# Patient Record
Sex: Male | Born: 1935 | Race: White | Hispanic: No | Marital: Married | Smoking: Current every day smoker
Health system: Southern US, Community
[De-identification: ages and names within clinical notes are randomized; demographics above are authoritative.]

## PROBLEM LIST (undated history)

## (undated) DIAGNOSIS — R972 Elevated prostate specific antigen [PSA]: Secondary | ICD-10-CM

## (undated) DIAGNOSIS — N472 Paraphimosis: Secondary | ICD-10-CM

## (undated) DIAGNOSIS — R011 Cardiac murmur, unspecified: Secondary | ICD-10-CM

## (undated) DIAGNOSIS — N400 Enlarged prostate without lower urinary tract symptoms: Secondary | ICD-10-CM

## (undated) DIAGNOSIS — R339 Retention of urine, unspecified: Secondary | ICD-10-CM

## (undated) DIAGNOSIS — E785 Hyperlipidemia, unspecified: Secondary | ICD-10-CM

## (undated) DIAGNOSIS — R310 Gross hematuria: Secondary | ICD-10-CM

## (undated) DIAGNOSIS — I639 Cerebral infarction, unspecified: Secondary | ICD-10-CM

## (undated) DIAGNOSIS — J449 Chronic obstructive pulmonary disease, unspecified: Secondary | ICD-10-CM

## (undated) DIAGNOSIS — R93429 Abnormal radiologic findings on diagnostic imaging of unspecified kidney: Secondary | ICD-10-CM

## (undated) DIAGNOSIS — I10 Essential (primary) hypertension: Secondary | ICD-10-CM

## (undated) DIAGNOSIS — Z466 Encounter for fitting and adjustment of urinary device: Secondary | ICD-10-CM

## (undated) DIAGNOSIS — N390 Urinary tract infection, site not specified: Secondary | ICD-10-CM

## (undated) DIAGNOSIS — C652 Malignant neoplasm of left renal pelvis: Secondary | ICD-10-CM

## (undated) DIAGNOSIS — K219 Gastro-esophageal reflux disease without esophagitis: Secondary | ICD-10-CM

## (undated) HISTORY — DX: Urinary tract infection, site not specified: N39.0

## (undated) HISTORY — DX: Abnormal radiologic findings on diagnostic imaging of unspecified kidney: R93.429

## (undated) HISTORY — DX: Hyperlipidemia, unspecified: E78.5

## (undated) HISTORY — DX: Gastro-esophageal reflux disease without esophagitis: K21.9

## (undated) HISTORY — PX: BACK SURGERY: SHX140

## (undated) HISTORY — PX: EAR MASTOIDECTOMY W/ COCHLEAR IMPLANT W/ LANDMARK: SHX1483

## (undated) HISTORY — PX: APPENDECTOMY: SHX54

## (undated) HISTORY — DX: Malignant neoplasm of left renal pelvis: C65.2

## (undated) HISTORY — DX: Elevated prostate specific antigen (PSA): R97.20

## (undated) HISTORY — PX: TONSILLECTOMY: SUR1361

## (undated) HISTORY — PX: CATARACT EXTRACTION W/ INTRAOCULAR LENS  IMPLANT, BILATERAL: SHX1307

## (undated) HISTORY — DX: Gross hematuria: R31.0

## (undated) HISTORY — DX: Chronic obstructive pulmonary disease, unspecified: J44.9

## (undated) HISTORY — DX: Paraphimosis: N47.2

## (undated) HISTORY — PX: COCHLEAR IMPLANT: SUR684

## (undated) HISTORY — DX: Essential (primary) hypertension: I10

## (undated) HISTORY — DX: Benign prostatic hyperplasia without lower urinary tract symptoms: N40.0

## (undated) HISTORY — PX: ELBOW SURGERY: SHX618

## (undated) HISTORY — DX: Encounter for fitting and adjustment of urinary device: Z46.6

## (undated) HISTORY — PX: TOTAL KNEE ARTHROPLASTY: SHX125

## (undated) HISTORY — DX: Retention of urine, unspecified: R33.9

---

## 1978-12-08 HISTORY — PX: FOOT SURGERY: SHX648

## 2007-04-03 ENCOUNTER — Other Ambulatory Visit: Payer: Self-pay

## 2007-04-04 ENCOUNTER — Inpatient Hospital Stay: Payer: Self-pay | Admitting: *Deleted

## 2008-12-08 DIAGNOSIS — I639 Cerebral infarction, unspecified: Secondary | ICD-10-CM

## 2008-12-08 HISTORY — DX: Cerebral infarction, unspecified: I63.9

## 2009-01-15 ENCOUNTER — Ambulatory Visit: Payer: Self-pay | Admitting: Internal Medicine

## 2009-07-25 ENCOUNTER — Emergency Department: Payer: Self-pay | Admitting: Emergency Medicine

## 2011-06-20 DIAGNOSIS — I351 Nonrheumatic aortic (valve) insufficiency: Secondary | ICD-10-CM | POA: Insufficient documentation

## 2011-08-25 DIAGNOSIS — N138 Other obstructive and reflux uropathy: Secondary | ICD-10-CM | POA: Insufficient documentation

## 2011-08-25 DIAGNOSIS — G2581 Restless legs syndrome: Secondary | ICD-10-CM | POA: Insufficient documentation

## 2011-08-27 DIAGNOSIS — L309 Dermatitis, unspecified: Secondary | ICD-10-CM | POA: Insufficient documentation

## 2011-12-24 DIAGNOSIS — M5412 Radiculopathy, cervical region: Secondary | ICD-10-CM | POA: Insufficient documentation

## 2011-12-24 DIAGNOSIS — M545 Low back pain, unspecified: Secondary | ICD-10-CM | POA: Insufficient documentation

## 2011-12-24 DIAGNOSIS — J449 Chronic obstructive pulmonary disease, unspecified: Secondary | ICD-10-CM | POA: Insufficient documentation

## 2012-05-22 ENCOUNTER — Inpatient Hospital Stay: Payer: Self-pay | Admitting: Internal Medicine

## 2012-05-22 LAB — CBC
HCT: 20.6 % — ABNORMAL LOW
HGB: 5.6 g/dL — ABNORMAL LOW
MCH: 16.4 pg — ABNORMAL LOW
MCHC: 27.4 g/dL — ABNORMAL LOW
MCV: 60 fL — ABNORMAL LOW
Platelet: 163 x10 3/mm 3
RBC: 3.45 x10 6/mm 3 — ABNORMAL LOW
RDW: 19 % — ABNORMAL HIGH
WBC: 5.6 x10 3/mm 3

## 2012-05-22 LAB — CK TOTAL AND CKMB (NOT AT ARMC)
CK, Total: 52 U/L
CK-MB: 1.8 ng/mL

## 2012-05-22 LAB — BASIC METABOLIC PANEL
BUN: 9 mg/dL (ref 7–18)
Calcium, Total: 8.8 mg/dL (ref 8.5–10.1)
Chloride: 107 mmol/L (ref 98–107)
EGFR (African American): >60
EGFR (Non-African Amer.): >60
Glucose: 109 mg/dL — ABNORMAL HIGH (ref 65–99)
Osmolality: 282 (ref 275–301)
Potassium: 3.8 mmol/L (ref 3.5–5.1)

## 2012-05-22 LAB — IRON AND TIBC
Iron Bind.Cap.(Total): 422 ug/dL
Iron Saturation: 3 %
Iron: 12 ug/dL — ABNORMAL LOW
Unbound Iron-Bind.Cap.: 410 ug/dL

## 2012-05-22 LAB — FOLATE: Folic Acid: 17.3 ng/mL (ref 3.1–100.0)

## 2012-05-22 LAB — FERRITIN: Ferritin (ARMC): 9 ng/mL (ref 8–388)

## 2012-05-22 LAB — LACTATE DEHYDROGENASE: LDH: 148 U/L (ref 87–241)

## 2012-05-22 LAB — PRO B NATRIURETIC PEPTIDE: B-Type Natriuretic Peptide: 351 pg/mL — ABNORMAL HIGH

## 2012-05-22 LAB — TROPONIN I: Troponin-I: <0.02 ng/mL

## 2012-05-23 LAB — CBC WITH DIFFERENTIAL/PLATELET
Basophil #: 0 10*3/uL (ref 0.0–0.1)
Basophil %: 0.8 %
Eosinophil %: 0.3 %
HCT: 29.5 % — ABNORMAL LOW (ref 40.0–52.0)
HGB: 8.7 g/dL — ABNORMAL LOW (ref 13.0–18.0)
Lymphocyte #: 0.4 10*3/uL — ABNORMAL LOW (ref 1.0–3.6)
Lymphocyte %: 13.8 %
MCH: 19.7 pg — ABNORMAL LOW (ref 26.0–34.0)
MCHC: 29.5 g/dL — ABNORMAL LOW (ref 32.0–36.0)
MCV: 67 fL — ABNORMAL LOW (ref 80–100)
Monocyte #: 0.1 x10 3/mm — ABNORMAL LOW (ref 0.2–1.0)
Monocyte %: 4.6 %
Neutrophil #: 2.3 10*3/uL (ref 1.4–6.5)
Neutrophil %: 80.5 %
RBC: 4.42 10*6/uL (ref 4.40–5.90)
RDW: 27.9 % — ABNORMAL HIGH (ref 11.5–14.5)
WBC: 2.8 10*3/uL — ABNORMAL LOW (ref 3.8–10.6)

## 2012-05-23 LAB — TROPONIN I
Troponin-I: 0.03 ng/mL
Troponin-I: 0.05 ng/mL

## 2012-05-23 LAB — BASIC METABOLIC PANEL
Calcium, Total: 9 mg/dL (ref 8.5–10.1)
Chloride: 103 mmol/L (ref 98–107)
EGFR (Non-African Amer.): >60
Glucose: 123 mg/dL — ABNORMAL HIGH (ref 65–99)
Osmolality: 275 (ref 275–301)
Sodium: 137 mmol/L (ref 136–145)

## 2012-05-23 LAB — CK-MB: CK-MB: 3.4 ng/mL (ref 0.5–3.6)

## 2012-05-23 LAB — CK TOTAL AND CKMB (NOT AT ARMC): CK, Total: 84 U/L (ref 35–232)

## 2012-05-24 LAB — CBC WITH DIFFERENTIAL/PLATELET
Basophil %: 0.5 %
Eosinophil %: 0.8 %
HCT: 27.8 % — ABNORMAL LOW (ref 40.0–52.0)
Lymphocyte #: 1.6 10*3/uL (ref 1.0–3.6)
Lymphocyte %: 20.9 %
MCHC: 30.2 g/dL — ABNORMAL LOW (ref 32.0–36.0)
MCV: 66 fL — ABNORMAL LOW (ref 80–100)
Monocyte #: 0.8 x10 3/mm (ref 0.2–1.0)
Monocyte %: 10.6 %
Neutrophil #: 5.2 10*3/uL (ref 1.4–6.5)
Neutrophil %: 67.2 %
RDW: 27.7 % — ABNORMAL HIGH (ref 11.5–14.5)

## 2012-05-26 LAB — EXPECTORATED SPUTUM ASSESSMENT W GRAM STAIN, RFLX TO RESP C

## 2012-05-27 ENCOUNTER — Ambulatory Visit: Payer: Self-pay | Admitting: Internal Medicine

## 2012-05-28 LAB — CULTURE, BLOOD (SINGLE)

## 2012-06-09 ENCOUNTER — Ambulatory Visit: Payer: Self-pay | Admitting: Internal Medicine

## 2012-06-09 LAB — CBC CANCER CENTER
Basophil #: 0.1 x10 3/mm (ref 0.0–0.1)
Basophil %: 1.7 %
Eosinophil #: 0.6 x10 3/mm (ref 0.0–0.7)
Lymphocyte %: 20.6 %
Neutrophil %: 58.8 %
Platelet: 235 x10 3/mm (ref 150–440)
RBC: 4.79 10*6/uL (ref 4.40–5.90)
WBC: 6.2 x10 3/mm (ref 3.8–10.6)

## 2012-06-09 LAB — RETICULOCYTES
Absolute Retic Count: 0.0911 10*6/uL — ABNORMAL HIGH (ref 0.024–0.084)
Reticulocyte: 1.9 % — ABNORMAL HIGH (ref 0.5–1.5)

## 2012-06-16 DIAGNOSIS — M5412 Radiculopathy, cervical region: Secondary | ICD-10-CM | POA: Insufficient documentation

## 2012-06-16 DIAGNOSIS — M5414 Radiculopathy, thoracic region: Secondary | ICD-10-CM | POA: Insufficient documentation

## 2012-07-08 ENCOUNTER — Ambulatory Visit: Payer: Self-pay | Admitting: Internal Medicine

## 2013-11-07 HISTORY — PX: TOTAL NEPHRECTOMY: SHX415

## 2014-07-02 ENCOUNTER — Emergency Department: Payer: Self-pay | Admitting: Emergency Medicine

## 2014-07-02 LAB — CBC
HCT: 46.9 % (ref 40.0–52.0)
HGB: 15.3 g/dL (ref 13.0–18.0)
MCH: 29.7 pg (ref 26.0–34.0)
MCHC: 32.6 g/dL (ref 32.0–36.0)
MCV: 91 fL (ref 80–100)
Platelet: 208 10*3/uL (ref 150–440)
RBC: 5.15 10*6/uL (ref 4.40–5.90)
RDW: 13.9 % (ref 11.5–14.5)
WBC: 9.2 10*3/uL (ref 3.8–10.6)

## 2014-07-02 LAB — URINALYSIS, COMPLETE
RBC,UR: 7740 /HPF (ref 0–5)
SPECIFIC GRAVITY: 1.008 (ref 1.003–1.030)
Squamous Epithelial: NONE SEEN

## 2014-07-02 LAB — BASIC METABOLIC PANEL
ANION GAP: 6 — AB (ref 7–16)
BUN: 9 mg/dL (ref 7–18)
CO2: 26 mmol/L (ref 21–32)
Calcium, Total: 9.6 mg/dL (ref 8.5–10.1)
Chloride: 105 mmol/L (ref 98–107)
Creatinine: 0.66 mg/dL (ref 0.60–1.30)
EGFR (African American): >60
EGFR (Non-African Amer.): >60
Glucose: 92 mg/dL (ref 65–99)
Osmolality: 272 (ref 275–301)
POTASSIUM: 4.1 mmol/L (ref 3.5–5.1)
SODIUM: 137 mmol/L (ref 136–145)

## 2014-07-04 ENCOUNTER — Ambulatory Visit: Payer: Self-pay | Admitting: Urology

## 2014-07-04 LAB — URINE CULTURE

## 2014-09-21 ENCOUNTER — Ambulatory Visit: Payer: Self-pay | Admitting: Urology

## 2014-09-21 LAB — URINALYSIS, COMPLETE
Bacteria: NONE SEEN
Bilirubin,UR: NEGATIVE
Blood: NEGATIVE
Glucose,UR: NEGATIVE mg/dL (ref 0–75)
Ketone: NEGATIVE
NITRITE: NEGATIVE
PROTEIN: NEGATIVE
Ph: 5 (ref 4.5–8.0)
RBC,UR: <1 /HPF (ref 0–5)
SQUAMOUS EPITHELIAL: NONE SEEN
Specific Gravity: 1.009 (ref 1.003–1.030)
WBC UR: 47 /HPF (ref 0–5)

## 2014-09-21 LAB — BASIC METABOLIC PANEL
ANION GAP: 7 (ref 7–16)
BUN: 14 mg/dL (ref 7–18)
CHLORIDE: 104 mmol/L (ref 98–107)
CO2: 28 mmol/L (ref 21–32)
Calcium, Total: 9.3 mg/dL (ref 8.5–10.1)
Creatinine: 0.73 mg/dL (ref 0.60–1.30)
EGFR (African American): >60
Glucose: 78 mg/dL (ref 65–99)
Osmolality: 277 (ref 275–301)
Potassium: 4.4 mmol/L (ref 3.5–5.1)
Sodium: 139 mmol/L (ref 136–145)

## 2014-09-21 LAB — CBC WITH DIFFERENTIAL/PLATELET
Basophil #: 0.1 10*3/uL (ref 0.0–0.1)
Basophil %: 1 %
EOS ABS: 0.5 10*3/uL (ref 0.0–0.7)
Eosinophil %: 5 %
HCT: 41 % (ref 40.0–52.0)
HGB: 13 g/dL (ref 13.0–18.0)
Lymphocyte #: 1.2 10*3/uL (ref 1.0–3.6)
Lymphocyte %: 13.7 %
MCH: 28.7 pg (ref 26.0–34.0)
MCHC: 31.7 g/dL — AB (ref 32.0–36.0)
MCV: 91 fL (ref 80–100)
MONO ABS: 0.8 x10 3/mm (ref 0.2–1.0)
Monocyte %: 8.6 %
Neutrophil #: 6.4 10*3/uL (ref 1.4–6.5)
Neutrophil %: 71.7 %
Platelet: 319 10*3/uL (ref 150–440)
RBC: 4.52 10*6/uL (ref 4.40–5.90)
RDW: 14.4 % (ref 11.5–14.5)
WBC: 8.9 10*3/uL (ref 3.8–10.6)

## 2014-09-21 LAB — PROTIME-INR
INR: 1
PROTHROMBIN TIME: 13 s (ref 11.5–14.7)

## 2014-09-21 LAB — APTT: Activated PTT: 40.6 secs — ABNORMAL HIGH (ref 23.6–35.9)

## 2014-09-23 LAB — URINE CULTURE

## 2014-10-10 ENCOUNTER — Ambulatory Visit: Payer: Self-pay | Admitting: Urology

## 2014-10-16 ENCOUNTER — Ambulatory Visit: Payer: Self-pay | Admitting: Urology

## 2014-10-16 LAB — URINALYSIS, COMPLETE
BLOOD: NEGATIVE
Bacteria: NONE SEEN
Bilirubin,UR: NEGATIVE
GLUCOSE, UR: NEGATIVE mg/dL (ref 0–75)
KETONE: NEGATIVE
Leukocyte Esterase: NEGATIVE
NITRITE: NEGATIVE
Ph: 5 (ref 4.5–8.0)
Protein: NEGATIVE
RBC,UR: <1 /HPF (ref 0–5)
SPECIFIC GRAVITY: 1.008 (ref 1.003–1.030)
Squamous Epithelial: <1
WBC UR: 2 /HPF (ref 0–5)

## 2014-10-18 LAB — URINE CULTURE

## 2014-10-23 ENCOUNTER — Ambulatory Visit: Payer: Self-pay | Admitting: Urology

## 2014-11-13 ENCOUNTER — Ambulatory Visit: Payer: Self-pay | Admitting: Urology

## 2014-11-14 ENCOUNTER — Ambulatory Visit: Payer: Self-pay | Admitting: Urology

## 2014-11-14 LAB — CBC
HCT: 31.8 % — AB (ref 40.0–52.0)
HGB: 10.2 g/dL — ABNORMAL LOW (ref 13.0–18.0)
MCH: 28.4 pg (ref 26.0–34.0)
MCHC: 32.1 g/dL (ref 32.0–36.0)
MCV: 89 fL (ref 80–100)
Platelet: 328 10*3/uL (ref 150–440)
RBC: 3.58 10*6/uL — ABNORMAL LOW (ref 4.40–5.90)
RDW: 14.8 % — ABNORMAL HIGH (ref 11.5–14.5)
WBC: 9.5 10*3/uL (ref 3.8–10.6)

## 2014-11-14 LAB — BASIC METABOLIC PANEL
Anion Gap: 6 — ABNORMAL LOW (ref 7–16)
BUN: 20 mg/dL — ABNORMAL HIGH (ref 7–18)
Calcium, Total: 9.4 mg/dL (ref 8.5–10.1)
Chloride: 106 mmol/L (ref 98–107)
Co2: 27 mmol/L (ref 21–32)
Creatinine: 0.83 mg/dL (ref 0.60–1.30)
EGFR (African American): >60
EGFR (Non-African Amer.): >60
Glucose: 93 mg/dL (ref 65–99)
OSMOLALITY: 280 (ref 275–301)
Potassium: 4.2 mmol/L (ref 3.5–5.1)
Sodium: 139 mmol/L (ref 136–145)

## 2014-11-14 LAB — URINALYSIS, COMPLETE
Bilirubin,UR: NEGATIVE
Glucose,UR: NEGATIVE mg/dL (ref 0–75)
Hyaline Cast: 2
Ketone: NEGATIVE
NITRITE: NEGATIVE
Ph: 6 (ref 4.5–8.0)
Protein: 30
SQUAMOUS EPITHELIAL: NONE SEEN
Specific Gravity: 1.012 (ref 1.003–1.030)

## 2014-11-14 LAB — APTT: ACTIVATED PTT: 41.1 s — AB (ref 23.6–35.9)

## 2014-11-14 LAB — PROTIME-INR
INR: 1.1
Prothrombin Time: 13.9 secs (ref 11.5–14.7)

## 2014-11-17 ENCOUNTER — Ambulatory Visit: Payer: Self-pay | Admitting: Internal Medicine

## 2014-11-17 LAB — URINE CULTURE

## 2014-11-20 ENCOUNTER — Inpatient Hospital Stay: Payer: Self-pay | Admitting: Urology

## 2014-11-21 LAB — CBC WITH DIFFERENTIAL/PLATELET
BASOS ABS: 0 10*3/uL (ref 0.0–0.1)
BASOS PCT: 0.3 %
EOS ABS: 0.2 10*3/uL (ref 0.0–0.7)
EOS PCT: 2.3 %
HCT: 29.4 % — ABNORMAL LOW (ref 40.0–52.0)
HGB: 9.4 g/dL — AB (ref 13.0–18.0)
LYMPHS ABS: 0.7 10*3/uL — AB (ref 1.0–3.6)
LYMPHS PCT: 8.8 %
MCH: 28.7 pg (ref 26.0–34.0)
MCHC: 31.9 g/dL — AB (ref 32.0–36.0)
MCV: 90 fL (ref 80–100)
Monocyte #: 0.5 x10 3/mm (ref 0.2–1.0)
Monocyte %: 5.8 %
NEUTROS ABS: 6.9 10*3/uL — AB (ref 1.4–6.5)
Neutrophil %: 82.8 %
Platelet: 210 10*3/uL (ref 150–440)
RBC: 3.27 10*6/uL — ABNORMAL LOW (ref 4.40–5.90)
RDW: 16.1 % — ABNORMAL HIGH (ref 11.5–14.5)
WBC: 8.3 10*3/uL (ref 3.8–10.6)

## 2014-11-21 LAB — BASIC METABOLIC PANEL
Anion Gap: 7 (ref 7–16)
BUN: 16 mg/dL (ref 7–18)
CALCIUM: 8.1 mg/dL — AB (ref 8.5–10.1)
CREATININE: 1.39 mg/dL — AB (ref 0.60–1.30)
Chloride: 107 mmol/L (ref 98–107)
Co2: 23 mmol/L (ref 21–32)
EGFR (African American): >60
EGFR (Non-African Amer.): 53 — ABNORMAL LOW
GLUCOSE: 103 mg/dL — AB (ref 65–99)
OSMOLALITY: 275 (ref 275–301)
Potassium: 4.3 mmol/L (ref 3.5–5.1)
Sodium: 137 mmol/L (ref 136–145)

## 2014-11-22 LAB — CBC WITH DIFFERENTIAL/PLATELET
BASOS ABS: 0 10*3/uL (ref 0.0–0.1)
Basophil %: 0.5 %
EOS PCT: 2.1 %
Eosinophil #: 0.2 10*3/uL (ref 0.0–0.7)
HCT: 27.3 % — ABNORMAL LOW (ref 40.0–52.0)
HGB: 8.6 g/dL — ABNORMAL LOW (ref 13.0–18.0)
LYMPHS PCT: 9.2 %
Lymphocyte #: 0.7 10*3/uL — ABNORMAL LOW (ref 1.0–3.6)
MCH: 28.2 pg (ref 26.0–34.0)
MCHC: 31.4 g/dL — ABNORMAL LOW (ref 32.0–36.0)
MCV: 90 fL (ref 80–100)
MONO ABS: 0.4 x10 3/mm (ref 0.2–1.0)
MONOS PCT: 5.6 %
Neutrophil #: 6.4 10*3/uL (ref 1.4–6.5)
Neutrophil %: 82.6 %
RBC: 3.04 10*6/uL — AB (ref 4.40–5.90)
RDW: 16.8 % — ABNORMAL HIGH (ref 11.5–14.5)
WBC: 7.7 10*3/uL (ref 3.8–10.6)

## 2014-11-22 LAB — BASIC METABOLIC PANEL
Anion Gap: 6 — ABNORMAL LOW (ref 7–16)
BUN: 16 mg/dL (ref 7–18)
CALCIUM: 7.9 mg/dL — AB (ref 8.5–10.1)
Chloride: 109 mmol/L — ABNORMAL HIGH (ref 98–107)
Co2: 24 mmol/L (ref 21–32)
Creatinine: 1.51 mg/dL — ABNORMAL HIGH (ref 0.60–1.30)
EGFR (African American): 58 — ABNORMAL LOW
EGFR (Non-African Amer.): 48 — ABNORMAL LOW
Glucose: 110 mg/dL — ABNORMAL HIGH (ref 65–99)
Osmolality: 279 (ref 275–301)
POTASSIUM: 4.4 mmol/L (ref 3.5–5.1)
Sodium: 139 mmol/L (ref 136–145)

## 2014-11-22 LAB — PLATELET COUNT: Platelet: 176 10*3/uL (ref 150–440)

## 2014-11-23 LAB — CREATININE, SERUM
CREATININE: 1.38 mg/dL — AB (ref 0.60–1.30)
EGFR (African American): >60
EGFR (Non-African Amer.): 53 — ABNORMAL LOW

## 2014-12-04 ENCOUNTER — Ambulatory Visit: Payer: Self-pay | Admitting: Urology

## 2014-12-07 ENCOUNTER — Ambulatory Visit: Payer: Self-pay | Admitting: Internal Medicine

## 2014-12-07 LAB — CBC CANCER CENTER
Basophil #: 0.1 x10 3/mm (ref 0.0–0.1)
Basophil %: 0.8 %
EOS PCT: 7.5 %
Eosinophil #: 0.7 x10 3/mm (ref 0.0–0.7)
HCT: 31.8 % — ABNORMAL LOW (ref 40.0–52.0)
HGB: 10.1 g/dL — AB (ref 13.0–18.0)
Lymphocyte #: 1.1 x10 3/mm (ref 1.0–3.6)
Lymphocyte %: 11.1 %
MCH: 27.5 pg (ref 26.0–34.0)
MCHC: 31.8 g/dL — AB (ref 32.0–36.0)
MCV: 86 fL (ref 80–100)
MONO ABS: 0.7 x10 3/mm (ref 0.2–1.0)
Monocyte %: 6.8 %
Neutrophil #: 7.3 x10 3/mm — ABNORMAL HIGH (ref 1.4–6.5)
Neutrophil %: 73.8 %
Platelet: 336 x10 3/mm (ref 150–440)
RBC: 3.68 10*6/uL — ABNORMAL LOW (ref 4.40–5.90)
RDW: 16.7 % — ABNORMAL HIGH (ref 11.5–14.5)
WBC: 9.9 x10 3/mm (ref 3.8–10.6)

## 2014-12-07 LAB — CREATININE, SERUM
CREATININE: 1.54 mg/dL — AB (ref 0.60–1.30)
EGFR (African American): 57 — ABNORMAL LOW
EGFR (Non-African Amer.): 47 — ABNORMAL LOW

## 2014-12-08 ENCOUNTER — Ambulatory Visit: Payer: Self-pay | Admitting: Internal Medicine

## 2015-03-31 NOTE — Discharge Summary (Signed)
PATIENT NAME:  Eric Mullins, Eric Mullins MR#:  [id number] DATE OF BIRTH:  1935-09-15  DATE OF ADMISSION:  10/23/2014 DATE OF DISCHARGE:  10/24/2014  DISCHARGE REASON:  Postoperative hematuria, hematuria secondary to renal cell collecting system tumor with instrumentation of same, and biopsy of same.   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   SURGEON: Rodgerick Gilliand D. Elnoria Howard DO and Gavin Pound MD.  COMPLICATIONS: Other than postoperative bleeding were none.   HOSPITAL COURSE:  The patient was admitted for control of postoperative bleeding, with 3-way 24 French Foley catheter.  Bleeding was well-controlled and by morning the urine catheter had clear drainage. The patient complained of some lower abdominal cramping and constipation and was given 2 ounces Milk of Magnesia and then began to have bowel movements.   He will be irrigated before discharge to make sure he is clear of clots. He will be seen in the morning for Foley catheter removal at the office if the urine remains clear through the night. The patient is urged to not smoke. He is discharged on his normal home meds. No antibiotics were given nor pain meds on discharge, as he merely has a Foley and he has pain meds at home to use. Follow-up in 24 to 48 hours for Foley catheter removal and a week for review of pathology report if possible, and then decision for further therapy.   CONDITION ON DISCHARGE:  Satisfactory.   PHYSICAL EXAMINATION: ABDOMEN: Soft. Bowel sounds present.  LUNGS: Expiratory wheezing which is chronic.  HEART: Sounds regular rate and rhythm. GENERAL:  Afebrile, alert and oriented x3. Eating and drinking copious amounts food.     ____________________________ Janice Coffin. Elnoria Howard, [place] rdh:kl D: 10/24/2014 13:34:32 ET T: 10/24/2014 15:14:53 ET JOB#: [id number]  cc: Janice Coffin. Elnoria Howard, DO,  <Dictator> Karley Pho D Tashae Inda DO ELECTRONICALLY SIGNED 11/22/2014 13:06

## 2015-03-31 NOTE — Op Note (Signed)
PATIENT NAME:  Eric Mullins, Eric Mullins MR#:  [id number] DATE OF BIRTH:  Oct 21, 1935  DATE OF PROCEDURE:  10/23/2014  PREOPERATIVE DIAGNOSES: Left upper tract filling defect, benign prostatic hyperplasia.  POSTOPERATIVE DIAGNOSES: Left upper tract filling defect, benign prostatic hyperplasia.  PROCEDURE PERFORMED: Left ureteroscopy, biopsy of left upper tract tumor, left ureteral stent placement.   ATTENDING SURGEON: Sherlynn Stalls, MD  ASSISTANT: Janice Coffin. Elnoria Howard, DO  SPECIMEN: Left collecting system biopsy.   DRAINS: A 6 x 26 French double-J ureteral stent on the left.   COMPLICATIONS: None.   INDICATION: This is a 80 year old male with a history of massive BPH with a prostatic volume of 150 mL, large  median lobe, as well as an elevated PSA to 17 status post negative prostate biopsy. As part of his hematuria workup, he underwent a CT urogram which did show left upper pole filling defects suspicious for upper tract TCC. Office cystoscopy revealed a large median lobe and the UO was unable to be identified. As such, he was counseled to undergo laser ablation of the median lobe of his prostate in order to access his ureteral orifice for the purpose of diagnostic ureteroscopy. Risks and benefits of the procedure were explained in detail. The patient agreed to proceed as planned.   PROCEDURE: The patient was correctly identified in the preoperative holding area and informed consent was obtained. He was brought to the operating suite and placed on the table in a supine position. At this time, universal timeout protocol was performed. All team members were identified. Venodyne boots were placed and he was administered IV Levaquin 500 mg in the perioperative period. He was then placed under general anesthesia, repositioned in the dorsal lithotomy position and prepped and draped in the standard surgical fashion. At this time, Dr. Elnoria Howard advanced a rigid cystoscope using a 65 French access sheath per urethra into  the bladder. Of note, his prostate was massively enlarged with a very elevated bladder neck. We were unable to identify the ureteral orifices at this time and therefore, the decision was made to go ahead and proceed with GreenLight laser ablation to create a trough and take down the bladder neck height in order to identify the ureteral orifice for this purpose. Please see Dr. Guinevere Ferrari dictation of his GreenLight laser ablation as he was primary surgeon for this portion of the procedure. Once this trough was created and the ureteral orifice on the left was able to be identified, it was cannulated using a Sensor wire up to the level of the kidney. A second wire was introduced using the dual-lumen access sheath. Of note, there was significant buckling and deviation of the bladder due to very large mass effect for the prostate making the procedure extremely difficult. Ultimately, a second wire was able to be advanced. We then attempted to place a Cook 12 [place] access sheath over the working wire snapping the safety wire in place, however, due to buckling we were unable to place this successfully. We tried the inner sheath of the cannula, which did pass through the ureteral orifice to the distal ureter, however, the outer sheath did not go easily. Due to the buckling, the decision was made to exchange one working wires for a Super Stiff wire, which was achieved using the same technique as previously described using the dual-lumen access sheath. We then attempted to place the access sheath over the stiffer wire, again we were unsuccessful at this point. Next, the rigid cystoscope was advanced per urethra into the  bladder over our Super Stiff wire in order to straighten out the wire and help reduce the amount of buckling in order to be able to more easily advance our instruments through the ureter. This was relatively successful and did assist in help straightening the wire reducing the amount of buckling. At this point, we  were able to advance a 7 [place] scope over the Super Stiff wire up to the level of the renal pelvis. Again, this was very technically difficult given the patient's prostate making maneuverability very difficult. Ultimately, we were able to access the upper pole where a large tumor filling the entire calyx was seen. It did have a somewhat low grade appearance but was relatively large in size. The Piranha biopsy forceps were then used to take a decent-sized hot biopsy, which was then backed down the ureter carefully with care to ensure that this piece would not become dislodged or lost. Ultimately, we did have a very small micro biopsy for analysis. At this point in time, the working wire was removed and the safety wire was backloaded over the rigid cystoscope. I then very carefully advanced a 6 x 26 French double-J ureteral stent over this wire up to the level of the renal pelvis. Again, this was quite difficult due to significant buckling in the bladder, but ultimately it was successful in creating a coil in the renal pelvis as well as a coil within the bladder when the wire was ultimately removed. The remainder of the procedure was turned over to Dr. Elnoria Howard who proceeded with additional hemostasis and catheter placement. There were no complications in this case.   ____________________________ Sherlynn Stalls, MD ajb:TT D: 10/23/2014 18:32:02 ET T: 10/23/2014 19:48:29 ET JOB#: [id number]  cc: Sherlynn Stalls, MD, <Dictator> Sherlynn Stalls MD ELECTRONICALLY SIGNED 11/22/2014 10:38

## 2015-03-31 NOTE — Op Note (Signed)
PATIENT NAME:  Eric Mullins, Eric Mullins MR#:  [id number] DATE OF BIRTH:  1935/07/10  DATE OF PROCEDURE:  10/23/2014  PREOPERATIVE DIAGNOSIS: Large middle lobe hypertrophy of the prostate and a left upper  pole intrarenal pelvic tumor.   POSTOPERATIVE DIAGNOSIS: Large middle lobe hypertrophy of the prostate and a left upper pole intrarenal pelvic tumor.   PROCEDURES: Cystoscopy, KPT laser of massive prostate, left retrograde ureteroscopy, left ureteral dilatation, renal pelvic tumor biopsy, stent placement and Foley catheter placement.  SURGEON: Rick Duff, Eric Mullins  DESCRIPTION OF PROCEDURE: The KPT laser procedure is as follows: With the patient sterilely prepped and draped, and in the supine lithotomy position i Eric Mullins the cystoscopy. I cannot identify the left ureter so I Eric Mullins a KPT laser on a massive middle lobe hypertrophy of the prostate.  I am able to make a large channel and break down the middle lobe hypertrophy somewhat and then I am able to see the left ureter. We instrument the ureter and the dictation on the ureteroscopy will be by Dr. Erlene Quan who did the ureteroscopy and renal pelvic tumor biopsy. At the end of the procedure, I put a Foley catheter in over a wire. This is a [employer] technical [employer]. I put it to full irrigation with constant bladder irrigation to clear. The patient will probably have to be admitted tonight. Most of the bleeding comes from the manipulation during the ureteroscopy. So at the end of the procedure, a B and O suppository is placed. He is sent to recovery in satisfactory condition with a fluoroscopically-guided stent in good position.  ____________________________ Eric Coffin. Elnoria Howard, Eric Mullins rdh:sb D: 10/23/2014 10:46:35 ET T: 10/23/2014 12:46:49 ET JOB#: [id number]  cc: Eric Coffin. Elnoria Howard, Eric Mullins, <Dictator> Neamiah Sciarra D Lelania Bia Eric Mullins ELECTRONICALLY SIGNED 10/23/2014 16:35

## 2015-03-31 NOTE — Discharge Summary (Signed)
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 Calculated eGFR, using the MRDR Study equation, is useful in  patients with stable renal function. The eGFR calculation will not be reliable in acutely ill patients when serum creatinine is changing rapidly. It is not useful in patients on dialysis. The eGFR calculation may not be applicable to patients at the low and high extremes of body sizes, pregnant women, and vegetarians.)   Pertinent Past History:  Pertinent Past History see admission H&P   Hospital Course:  Hospital Course 80year old male admitted postop after left hand assist laparoscopic nephroureterectomy with open bladder cuff.  He arrived in stable condition to the floor.  His creatinine stabilized at 1.38 prior to discharge and his hematocrit remained stable.  Foley catheter remained in place.  His diet was advanced and he had a bowel movement prior to discharge.  His was controlled with oral pain medications.  Due to his difficulty ambulating at baseline, physical therapy was consulted who recommended short-term rehabilitation.  He was ultimately discharged with his Foley catheter in place in stable condition.   Condition on Discharge Fair   Code Status:  Code Status Full Code   DISCHARGE INSTRUCTIONS HOME MEDS:  Medication Reconciliation: Patient's Home Medications at Discharge:     Medication  Instructions  aspirin litecoat 325 mg oral tablet  1 tab(s) orally once a day   advair diskus 250 mcg-50 mcg inhalation powder  1 puff(s) inhaled 2 times a day   doxazosin 8 mg oral tablet  1 tab(s) orally once a day (in the morning)   folic acid 1 mg oral tablet  1 tab(s) orally once a day   icaps  1 cap(s) orally once a day   omeprazole 20 mg oral delayed release capsule  1 cap(s) orally once a day (at bedtime)   multivitamin  1 tab(s) orally once a day   pravastatin 40 mg oral tablet  1 tab(s) orally once a day (at bedtime)   albuterol-ipratropium 2.5 mg-0.5 mg/3 ml inhalation solution  3 milliliter(s) inhaled 2 times a day   ferrous sulfate 325 mg (65 mg elemental iron) oral tablet  1 tab(s) orally once a day   lisinopril 10 mg oral tablet  1 tab(s) orally once a day (at bedtime)   tamsulosin 0.4 mg oral capsule  1 cap(s) orally once a day (at bedtime)   acetaminophen-oxycodone 325 mg-5 mg oral tablet  1 tab(s) orally every 4 hours, As Needed, moderate pain (4-6/10) - for Pain , As needed, moderate pain (4-6/10)   amoxicillin 500 mg oral capsule  1 cap(s) orally every 8 hours   colace sodium 100 mg oral capsule  1 cap(s) orally 2 times a day    PRESCRIPTIONS: PRINTED AND PLACED ON CHART  STOP TAKING THE FOLLOWING MEDICATION(S):    nitrofurantoin:  orally   Physician's Instructions:  Dressing Care You may shower  Diet Regular   Dietary Supplements None   Diet Consistency Regular Consistency   Activity Limitations No heavy lifting  4-6 weeks   Return to Work after follow up visit with MD   Time frame for Follow Up Appointment 1-2 weeks  Voiding trial in the office     [name] J(Attending Physician): [place], [address] [place], [place], [place] [address], [place] [phone]  TIME SPENT:  Total Time: 30 minutes or less   Electronic Signatures: Sherlynn Stalls (MD)  (Signed 17-Dec-15 08:34)  Authored: ADMISSION DATE AND DIAGNOSIS, CHIEF  COMPLAINT/HPI, Allergies, PERTINENT LABS, PERTINENT PAST HISTORY, HOSPITAL COURSE, DISCHARGE INSTRUCTIONS HOME MEDS, PATIENT INSTRUCTIONS, Follow Up Physician, TIME SPENT   Last Updated: 17-Dec-15 08:34 by Sherlynn Stalls (MD)

## 2015-04-01 NOTE — Consult Note (Signed)
Colonoscopy showed no polyps, masses, etc.  Can go home tomorrow, will start regular diet.  Electronic Signatures: Manya Silvas (MD)  (Signed on 18-Jun-13 17:15)  Authored  Last Updated: 18-Jun-13 17:15 by Manya Silvas (MD)

## 2015-04-01 NOTE — Consult Note (Signed)
Pt EGD showed hiatal hernia, mild prepyloric gastritis, minimal duodenitis and one small AVM of duodenum second portion which was zapped with argon at setting of 30 and 0.8 with good results.  Sputum cultures back, chronic smoking of 3 packs a day until 6 years ago.  Will do colonoscopy tomorrow.   Electronic Signatures: Manya Silvas (MD)  (Signed on 17-Jun-13 11:33)  Authored  Last Updated: 17-Jun-13 11:33 by Manya Silvas (MD)

## 2015-04-01 NOTE — Consult Note (Signed)
See consult, plan to do EGD tomorrow and colonoscopy the following day.  Hx of colon polyps, father with colon cancer,  patient has had chronic anemia on chronic iron therapy but stopped 2-3 years ago by primary doctor.    Electronic Signatures: Manya Silvas (MD)  (Signed on 16-Jun-13 14:45)  Authored  Last Updated: 16-Jun-13 14:45 by Manya Silvas (MD)

## 2015-04-01 NOTE — H&P (Signed)
PATIENT NAME:  Eric Mullins, Eric Mullins MR#:  [id number] DATE OF BIRTH:  1935-01-13  DATE OF ADMISSION:  05/22/2012  REFERRING PHYSICIAN: Dr. Jasmine December   FAMILY PHYSICIAN: None local.   REASON FOR ADMISSION: Acute respiratory failure with shortness of breath.   HISTORY OF PRESENT ILLNESS: The patient is a 80 year old male with a history of COPD, hypertension, hyperlipidemia, and benign prostatic hypertrophy. He quit smoking several years ago. He presents to the Emergency Room with respiratory distress with a respiratory rate in the 50's with audible wheezes. In the Emergency Room, the patient was found to have a hemoglobin of 5.6. The patient gives a history of anemia in the past requiring iron therapy. He has not been on iron recently. Denies hematemesis or melena. No hemoptysis. No bright red blood per rectum. He is now admitted for further evaluation.   PAST MEDICAL HISTORY:  1. History of iron deficiency anemia.  2. Chronic obstructive pulmonary disease.  3. Chronic low back pain.  4. Benign hypertension.  5. Hyperlipidemia.  6. Benign prostatic hypertrophy.  7. Status post left knee surgery.  8. Status post left elbow surgery.   MEDICATIONS:  1. Prilosec 20 mg p.o. daily.  2. Spiriva 1 capsule inhaled daily.  3. Oxycodone 5 mg p.o. q.4 hours p.r.n. pain.  4. Lovastatin 40 mg p.o. at bedtime.  5. DuoNeb SVNs q.4 hours p.r.n. shortness of breath.  6. Flexeril 10 mg p.o. q.8 hours p.r.n. pain.  7. Cardura 8 mg p.o. daily. 8. Aspirin 81 mg p.o. daily.  9. Advair 250/50 1 puff b.i.d.   ALLERGIES: No known drug allergies.   SOCIAL HISTORY: The patient quit smoking greater than six years ago. No history of alcohol abuse.   FAMILY HISTORY: Positive for hypertension and heart disease.   REVIEW OF SYSTEMS: CONSTITUTIONAL: No fever or change in weight. EYES: No blurred or double vision. No glaucoma. ENT: No tinnitus or hearing loss. No nasal discharge or bleeding. No difficulty swallowing.  RESPIRATORY: The patient has had cough and wheezing. Denies hemoptysis. No painful respiration. CARDIOVASCULAR: No chest pain although he does admit to orthopnea. Denies palpitations or syncope. GI: No nausea, vomiting, or diarrhea. No abdominal pain. No change in bowel habits. GU: No dysuria or hematuria. No incontinence. ENDOCRINE: No polyuria or polydipsia. No heat or cold intolerance. HEMATOLOGIC: The patient admits to a history of anemia. No easy bruising or bleeding. LYMPHATIC: No swollen glands. MUSCULOSKELETAL: The patient has pain in his back but denies pain in his neck, shoulders, knees, or hips. No gout. NEUROLOGIC: No numbness although he feels generally weak. Denies migraines, stroke, or seizures. PSYCH: The patient denies anxiety, insomnia, or depression.   PHYSICAL EXAMINATION:   GENERAL: The patient is acutely ill appearing in moderate respiratory distress.   VITAL SIGNS: Vital signs are currently remarkable for a blood pressure of 152/57 with a heart rate of 95 and a respiratory rate of 32. He is afebrile.   HEENT: Normocephalic, atraumatic. Pupils equally round and reactive to light and accommodation. Extraocular movements are intact. Sclerae nonicteric. Conjunctivae are clear. Oropharynx is clear.   NECK: Supple without JVD or bruits. No adenopathy or thyromegaly is noted.   LUNGS: Diffuse expiratory wheezes with basilar rales. Prolonged expiration is noted. No dullness. Respiratory effort is increased.   CARDIAC: Regular rate and rhythm with normal S1 and S2. There is a 2/6 systolic ejection murmur noted. No rubs or gallops present.   ABDOMEN: Soft, nontender with normoactive bowel sounds. No organomegaly or masses were  appreciated. No hernias or bruits were noted.   EXTREMITIES: Without clubbing, cyanosis, or edema. Pulses were 2+ bilaterally.   SKIN: Pale but warm and dry without rash or lesions.   NEUROLOGIC: Cranial nerves II through XII grossly intact. Deep tendon  reflexes were symmetric. Motor and sensory exams nonfocal.   PSYCHIATRIC: Alert and oriented to person, place, and time. He was cooperative and used good judgment.   LABORATORY DATA: ABG revealed a pO2 of 48 on room air with a sat of 84.7%. BNP was elevated at 351. Glucose 109 with a BUN of 9, creatinine of 0.73 with a sodium of 142 and a potassium of 3.8. White count was 5.6 with a hemoglobin of 5.6 and a platelet count of 163 with an MCV of 60. Total CK was 52 with an MB of 1.8 and a troponin of less than 0.02.   Chest x-ray revealed interstitial edema.   EKG revealed sinus rhythm with no acute ischemic changes.   ASSESSMENT:  1. Acute respiratory failure.  2. High output congestive heart failure.  3. COPD exacerbation.  4. Microcytic anemia.  5. Chronic low back pain.  6. Benign hypertension.  7. Benign prostatic hypertrophy.   PLAN:  1. The patient will be admitted to telemetry with oxygen.  2. Will send off anemia labs and guaiac all stools.  3. Will transfuse 3 units of packed red blood cells at this time.  4. Will begin IV Lasix for diuresis and empiric beta-blocker therapy.  5. Will maximize his pulmonary toilet with IV steroids, Xopenex and Atrovent SVNs, and continue his Advair and Spiriva.  6. Will begin empiric IV antibiotics at this time.  7. Will hold off on consults to GI and Hematology until further information comes in.  8. Will follow-up routine labs and a chest x-ray in the morning.  9. Will obtain an echocardiogram at this time and follow serial cardiac enzymes.  10. Further treatment and evaluation will depend upon the patient's progress.   TOTAL TIME SPENT ON THIS PATIENT: 50 minutes.   ____________________________ Leonie Douglas Doy Hutching, MD jds:drc D: 05/22/2012 18:24:43 ET T: 05/23/2012 08:06:10 ET JOB#: [id number]  cc: Leonie Douglas. Doy Hutching, MD, <Dictator> Lenin Kuhnle Lennice Sites MD ELECTRONICALLY SIGNED 05/23/2012 14:46

## 2015-04-01 NOTE — Consult Note (Signed)
PATIENT NAME:  Eric Mullins, Eric Mullins MR#:  [id number] DATE OF BIRTH:  1935/09/04  DATE OF CONSULTATION:  05/23/2012  REFERRING PHYSICIAN:   CONSULTING PHYSICIAN:  Manya Silvas, MD  HISTORY OF PRESENT ILLNESS: The patient is a 80 year old white male who was admitted to the hospital because of respiratory problems and found to have a hemoglobin of 5.6. I was asked to see him in consultation.   The patient has been on iron in the past for anemia. He has not been on any iron in the last 2 to 3 years. It was stopped by his primary doctor at that time because his hemoglobin had built up. The patient does remember that when he had back surgery four years ago he received 2 pints of blood.   The patient had severe hypochromic microcytic indices on his CBC indicating severe iron deficiency anemia.   Since admission the patient has had 3 pints of blood and feels considerably better.   His last colonoscopy was three years ago and they removed several polyps.   FAMILY HISTORY: Father had colon cancer as well as lung cancer.   PAST MEDICAL HISTORY:  1. Iron deficiency anemia.  2. Chronic obstructive pulmonary disease.  3. Had back surgery for chronic low back pain about four years ago. A week after the back surgery he had a stroke that paralyzed his throat and his right arm and these symptoms resolved over 2 or 3 weeks.  4. Hypertension.  5. Hyperlipidemia.  6. Benign prostatic hypertrophy.  7. Total left knee replacement in 1994. He has been told that he needs another one.  8. Left elbow surgery. He has a "floating bone" in it.  MEDICATIONS: 1. Prilosec 20 mg a day.  2. Spiriva 1 capsule inhaled daily.  3. Oxycodone 5 mg q.4 hours p.r.n. for pain.  4. Lovastatin 40 mg at bedtime.  5. DuoNeb SVN q.4 hours p.r.n. for shortness of breath.  6. Flexeril 10 mg q.8 hours p.r.n. for pain. 7. Cardura 8 mg a day.  8. Aspirin 325 mg daily.  9. Advair 250/50 1 puff b.i.d.   ALLERGIES: No known drug  allergies.   HABITS: The patient used to smoke 3 packs of cigarettes a day, quit about six years ago. No history of alcohol use. The patient is a retired Psychologist, counselling.   FAMILY HISTORY: Positive for hypertension, heart disease, and colon cancer.   REVIEW OF SYSTEMS: No fever. He has had shortness of breath, dyspnea on exertion, coughing, wheezing. No coughing up blood. No chest pains. No skipping irregular heartbeats. No nausea, vomiting, diarrhea, or rectal bleeding. No dysuria or hematuria.   PHYSICAL EXAMINATION:   GENERAL: White male in no acute distress. He says he feels much better since his transfusions.   HEENT: Sclerae nonicteric. Conjunctivae negative. Tongue is negative.   HEAD: Atraumatic. Trachea is in the midline.   CHEST: Clear. On admission he had diffuse expiratory wheezes and rales. Chest exam is much improved over that written for his admission.   HEART: 1 to 2/6 systolic murmur.   ABDOMEN: Nontender. No hepatosplenomegaly. No masses. No bruits.   EXTREMITIES: No edema. Radial pulses 2+ on the right, 1+ on the left.   PSYCH: The patient is alert, oriented, good historian which is supplemented by his wife.  LABORATORY DATA ON ADMISSION: pO2 48 on room air, sat of 84%. BNP 351. Glucose 109, BUN 9, creatinine 0.7, sodium 142, potassium 3.8, hemoglobin 5.6, platelet count 163, white count 5.6. Troponin  and MB are negative.   Chest x-ray showed interstitial edema.   EKG showed sinus rhythm. No acute changes.   ASSESSMENT: Severe hypochromic microcytic anemia in a patient with previous colon polyps in a patient who has previously had transfusions and iron deficiency anemia. He may have chronic blood loss from AVMs of the GI tract. He could have silent ulcer causing him to have chronic blood loss although he has not been having black stools.   RECOMMENDATIONS:  1. Plan to do upper endoscopy tomorrow.  2. If negative, do colonoscopy on Tuesday.    ____________________________ Manya Silvas, MD rte:drc D: 05/23/2012 14:42:22 ET T: 05/23/2012 15:04:08 ET JOB#: [id number]  cc: Manya Silvas, MD, <Dictator> Monica Becton at [place] MD ELECTRONICALLY SIGNED 06/01/2012 16:09

## 2015-04-01 NOTE — Discharge Summary (Signed)
PATIENT NAME:  Eric Mullins, Eric Mullins MR#:  [id number] DATE OF BIRTH:  07/28/1935  DATE OF ADMISSION:  05/22/2012 DATE OF DISCHARGE:  05/26/2012  PRESENTING COMPLAINT: Shortness of breath.   DISCHARGE DIAGNOSES:  1. Severe anemia, iron deficiency anemia status post 2 units blood transfusion.  2. Mild gastritis noted on esophagogastroduodenoscopy.  3. Chronic obstructive pulmonary disease with acute bronchitis. 4. Mild diastolic heart failure.   CONDITION ON DISCHARGE: Fair.   PROCEDURES: EGD showed gastritis, hiatal hernia, and single duodenal angiectasia status post coagulation.   Colonoscopy showed diverticulosis and internal hemorrhoids.   DISCHARGE MEDICATIONS:  1. Levaquin 500 mg p.o. daily for two more days.  2. Advair 250/50 one puff twice a day. 3. Ferrous sulfate 325 mg p.o. three times daily. 4. Lovastatin extended-release 20 mg at bedtime.  5. Prilosec 20 mg daily.  6. Doxazosin 4 mg daily.  7. Aspirin enteric coated 325 mg p.o. daily.  8. Ropinirole 1 mg p.o. daily.  9. Vicodin 5/500 mg 1 to 2 every four hours as needed.  10. Nebulizers every four hours as before.  11. Triamcinolone apply topically to affected area.   DISCHARGE FOLLOWUP: Followup with Dr. Inez Pilgrim in the [facility], hematology, on 06/09/2012 at 1:30 p.m. Follow-up with your primary care physician in 1 to 2 weeks.   CONSULTANTS: Gaylyn Cheers, MD - Gastroenterology.  DISCHARGE LABS/STUDIES: Hemoglobin was 8.4, hematocrit 27.8, platelet count 148, and white count 7.7. Stool occult feces was negative. Basic metabolic panel was within normal limits. Troponin x3 negative.   Blood cultures remained negative in 36 hours.   Sputum culture grew heavy growth of Proteus mirabilis and heavy growth of Haemophilus parainfluenzae, which is pansensitive.   Vitamin B12 485. Ferritin 9. Serum iron 12. Folic acid 10.1. LDH 148.   BRIEF SUMMARY OF HOSPITAL COURSE: [name] is a 80 year old Caucasian gentleman with  past medical history of chronic obstructive pulmonary disease and hypertension who came to the emergency room with:  1. Severe iron deficiency anemia with increasing shortness of breath and weakness. The patient was found to have mild heart failure which appeared to be high output failure, acute diastolic, in the setting of severe anemia. He received some IV Lasix which was discontinued once the patient was euvolemic. His Echo showed an ejection fraction of 50% and mild to moderate MR and mild-to-moderate TR.  2. Chronic obstructive pulmonary disease exacerbation with acute bronchitis. The patient initially received a high dose of IV Solu-Medrol which was discontinued once his wheezing improved. He was started on an IV antibiotic, which was changed to p.o. Levaquin for acute bronchitis. Chest x-ray did not show any signs of pneumonia. White count was normal. The patient is an ex-smoker. His inhalers and nebulizers were continued.  3. Severe iron deficiency anemia. Stool guaiac was negative x2. Iron studies show iron deficiency anemia. The patient underwent an EGD and colonoscopy and results as above were noted. He also underwent 3 units of blood transfusion. He will follow up with hematology, Dr. Inez Pilgrim, as an outpatient for further work-up and monitoring of his anemia.  4. Hypertension, on beta blockers and doxazosin.           The patient's overall hospital stay remained stable. He remained a FULL CODE.   TIME SPENT: 40 minutes.  ____________________________ [name], MD sap:slb D: 05/26/2012 14:12:30 ET T: 05/26/2012 14:55:04 ET JOB#: [id number]  cc: Tenessa Marsee A. Posey Pronto, MD, <Dictator> Simonne Come. Inez Pilgrim, MD Ilda Basset MD ELECTRONICALLY SIGNED 05/29/2012 15:46

## 2015-04-02 LAB — SURGICAL PATHOLOGY

## 2015-04-04 NOTE — Op Note (Signed)
PATIENT NAME:  Eric Mullins, Eric Mullins MR#:  [id number] DATE OF BIRTH:  January 20, 1935  DATE OF PROCEDURE:  11/20/2014  PREOPERATIVE DIAGNOSIS: Left upper tract transitional cell carcinoma.   POSTOPERATIVE DIAGNOSIS: Left upper tract transitional cell carcinoma.  PROCEDURE PERFORMED: Left-hand assist laparoscopic nephroureterectomy with open bladder cuff.   ATTENDING SURGEON: Sherlynn Stalls, MD   ASSISTANT: Janice Coffin. Elnoria Howard, DO  ESTIMATED BLOOD LOSS: 25 mL.  DRAINS: A 16 French coude Foley catheter.   SPECIMENS: Left kidney with ureter, lymph node   COMPLICATIONS: Anuria throughout the case.   INDICATION: This is a 80 year old male with hematuria, found to have a left upper pole filling defect on CT urogram some months ago. He underwent additional extensive urologic workup including office cystoscopy, prostate biopsy, which were negative. He was found to have a very large prostate gland, over 100 mL. He was taken to the operating suite for diagnostic left ureteroscopy, which was very technically challenging given his enlarged prostate. During that procedure, a GreenLight channel TURP was performed to create a channel in order to expose his left ureteral orifice and accommodate the left ureteroscope. Ultimately, we were able to directly visualize the tumor in the left upper pole with biopsy confirming the presence of papillary cells, grade unknown. He was counseled on his various treatment options for this lesion, and given its limited accessibility endoscopically, he was counseled to undergo left nephroureterectomy. Risks and benefits of the procedure were explained in detail with the patient who agreed to proceed as planned.   DETAILS OF PROCEDURE: The patient was correctly identified in the preoperative holding area and informed consent was confirmed. He was brought to the operating suite and placed on the table in the supine position. At this time, universal timeout protocol was performed. All team  members were identified. Venodyne boots were placed and he was administered several antibiotics in the perioperative period, including Levaquin, gentamicin and ampicillin given his history of recurrent urinary tract infections with enterococcus. He was then placed under general anesthesia and his previous indwelling Foley catheter was removed. He was then placed in the lateral decubitus position and with his left flank up and care was taken to pad all pressure points. He was strapped to the table using gel pads, strapped and taped in 3 locations and an axillary roll was also placed. His abdomen was then shaved and then he was prepped and draped in the standard surgical fashion. A 16 French coude catheter was placed sterilely on the field and a towel was placed over the penis for the remainder of the case. At this point in time, a transverse lower abdominal incision approximately 7 cm in length was created in the left lower quadrant. Exparel local anesthesia was used in all incision sites at the beginning of the case. The incision was then carried down through the subcutaneous tissues using Bovie electrocautery and the fascia was then opened. The rectus muscle was displaced medially and the oblique muscles were then split. The peritoneum was then opened using metzenbaum scissors and care was taken to avoid any injury to the bowel. A laparoscopic gel port was then placed in this position and the abdomen was insufflated to 15 mmHg. He tolerated this nicely without any evidence of hypotension or bradycardia. Three additional laparoscopic ports were placed, 2 just to the left of midline, 1 below the level of his xiphoid 5 mm and a second 12 mm port just above and lateral to the umbilicus. A final 12 mm port was placed  in the left flank. The bowel was then inspected and there was no injury to the bowel noted and hot scissors were used along with the surgeon's hand to incise the white line of Toldt to reflect the colon  medially. Gerota fascia was then exposed. The left ureter was then identified and then traced proximally until a lower pole artery was identified as well as a lower pole vein. These were carefully dissected free and ligated individually starting with the artery followed by the vein using a 45 mm laparoscopic vascular load stapler. Dissection was carried out further superiorly until the hilum was identified. A single artery with a branch was identified as well as a single vein. These structures were carefully freed. Prior to ligating these for further mobilization, the upper pole was mobilized off the spleen and the hepatorenal ligaments were ligated. The lateral attachments to the kidney were also freed up so that the kidney was able to the placed on stretch aiding in the dissection of the hilar vessels. At this point in time, the renal artery could be isolated and this was then ligated using the same 45 mm vascular load staple. Renal vein was then ligated in a similar fashion. The remaining attachments of the kidney were then freed. The adrenal gland was not directly visualized but the dissection on the superior medial aspect of the kidney was performed very close to the capsule to ensure that the adrenal gland was spared. At this point, the kidney was completely free other than the tail of the Gerota fascia which was then taken in a piecewise fashion using the LigaSure. Next, the ureter was dissected caudally and freed up off the iliac vessels. This was carried down deep into the pelvis until laparoscopic approach became somewhat cumbersome. The kidney was then placed in a large laparoscopic bag. Then, 12 mm port sites were closed using a [place] and tied using an 0 Vicryl tie.   The hand gel port was then removed and a Bookwalter was brought in. This incision was extended slightly laterally to create a slightly larger incision for further dissection of the ureter. The ureteral dissection was carried  down to the level of the trigone and freed up from the detrusor muscle. Prior to further dissection, the distal end of the ureter was clipped to ensure no tumor spillage. Unfortunately, shortly thereafter the ureter was inadvertently avulsed from the level of the bladder; however, the dissection distally was deemed more than adequate as the prostate was palpable at this level. This was passed off the field as a surgical specimen. The bladder was filled with 200 mL of saline and there was no leak noted from the detrusor whatsoever. I did attempt to locate the very small cystotomy; however, this was not easily identified and there was no evidence of leak. At this point, there was no evidence of bleeding noted throughout the dissection bed. Of note, prior to desufflating the abdomen, we did place FloSeal inside of the hilum as well as Surgicel in the bed of the liver. Finally, the hand incision port site was closed using a running #1 looped PDS. The overlying skin was closed using 4-0 Monocryl in a subcuticular fashion. All port sites were closed in the same way. Dermabond dressing was applied. At this point, the patient with repositioned in the supine position, reversed from anesthesia and taken to the PACU in stable condition.    ____________________________ Sherlynn Stalls, MD ajb:TT D: 11/21/2014 17:43:00 ET T: 11/21/2014 20:16:37 ET  JOB#: [id number]  cc: Sherlynn Stalls, MD, <Dictator> Sherlynn Stalls MD ELECTRONICALLY SIGNED 12/20/2014 15:39

## 2015-06-15 ENCOUNTER — Encounter: Payer: Self-pay | Admitting: Urology

## 2015-06-15 ENCOUNTER — Ambulatory Visit (INDEPENDENT_AMBULATORY_CARE_PROVIDER_SITE_OTHER): Payer: PPO | Admitting: Urology

## 2015-06-15 VITALS — BP 157/80 | HR 67 | Ht 68.0 in | Wt 146.6 lb

## 2015-06-15 DIAGNOSIS — C642 Malignant neoplasm of left kidney, except renal pelvis: Secondary | ICD-10-CM

## 2015-06-15 DIAGNOSIS — N401 Enlarged prostate with lower urinary tract symptoms: Secondary | ICD-10-CM

## 2015-06-15 DIAGNOSIS — R3914 Feeling of incomplete bladder emptying: Secondary | ICD-10-CM

## 2015-06-15 DIAGNOSIS — N390 Urinary tract infection, site not specified: Secondary | ICD-10-CM | POA: Diagnosis not present

## 2015-06-15 DIAGNOSIS — R972 Elevated prostate specific antigen [PSA]: Secondary | ICD-10-CM

## 2015-06-15 LAB — URINALYSIS, COMPLETE
Bilirubin, UA: NEGATIVE
GLUCOSE, UA: NEGATIVE
KETONES UA: NEGATIVE
Leukocytes, UA: NEGATIVE
Nitrite, UA: NEGATIVE
Specific Gravity, UA: 1.015 (ref 1.005–1.030)
Urobilinogen, Ur: 0.2 mg/dL (ref 0.2–1.0)
pH, UA: 6 (ref 5.0–7.5)

## 2015-06-15 LAB — MICROSCOPIC EXAMINATION

## 2015-06-15 LAB — BLADDER SCAN AMB NON-IMAGING: Scan Result: 194

## 2015-06-15 MED ORDER — CIPROFLOXACIN HCL 500 MG PO TABS
500.0000 mg | ORAL_TABLET | Freq: Once | ORAL | Status: AC
  Administered 2015-06-15: 500 mg via ORAL

## 2015-06-15 MED ORDER — LIDOCAINE HCL 2 % EX GEL
1.0000 "application " | Freq: Once | CUTANEOUS | Status: AC
  Administered 2015-06-15: 1 via URETHRAL

## 2015-06-15 NOTE — Progress Notes (Signed)
06/15/2015 10:39 AM   Eric Mullins [birth year] [id number]  Referring provider: Arlis Porta., MD [place],  [id number]  Chief Complaint  Patient presents with  . Cysto    HPI: 80 yo M with history of gross hematuria, elevated PSA found to have left upper pole filling defect suspicious for upper tract TCC on CT Urogram 06/2014. Further workup of this lesion revealed a papillary upper pole lesion consistent with upper tract TCC. Given the inability to easily survey this lesion endoscopically, he underwent left hand-assisted laparoscopic nephroureterectomy on 11/20/2014.   Surgical pathology showed a low-grade superficial papillary ( inverted architecture) without evidence of invasion. Single lymph node was negative for malignancy. pTaN0Mx  He underwent prostate biopsy on 07/18/14 which was negative for malignancy in setting of elevated PSA to 17.7 (07/03/14), repeat 7.7 ng/dL in 02/10/15. TRUS vol 149 cc with large median lobe noted.   Cystoscopy revealed large median lobe with moderate bladder trabeculation, no bladder masses and he is currently undergoing q3 month cytoscopy.    Prior to nephroureterectomy, he did note a 50 pound unintentional weight loss over the past year. He continues to smoke. Given the concern for secondary malignancy, he recently underwent CT PET scan which showed no obvious hypermetabolic disease elsewhere in the chest abdomen or pelvis. He does have multiple pulmonary nodules which are being followed by oncology.  Today, the patient denies any significant urinary symptoms. He reports that he is overall been voiding well. At one point prior to surgery, he was on intermittent catheterization for urinary retention but has since stopped doing this over the past few months due to bleeding from catheter trauma. He does feel like he is emptying his bladder for the most part. He has not been treated for urinary tract infection since his  last visit.  He returns today for repeat cysto.   PMH: Past Medical History  Diagnosis Date  . Hypertension   . Hyperlipidemia   . COPD (chronic obstructive pulmonary disease)   . GERD (gastroesophageal reflux disease)   . Urinary tract infection   . Paraphimosis   . Gross hematuria   . Kidney filling defect   . BPH (benign prostatic hyperplasia)   . Encounter for removal of urinary catheter   . Primary cancer of left renal pelvis   . Elevated PSA   . Incomplete bladder emptying     Surgical History: Past Surgical History  Procedure Laterality Date  . Ear mastoidectomy w/ cochlear implant w/ landmark    . Back surgery    . Total knee arthroplasty Left   . Elbow surgery Left     Home Medications:    Medication List       This list is accurate as of: 06/15/15 10:39 AM.  Always use your most recent med list.               aspirin 81 MG tablet  Take 81 mg by mouth daily.     ferrous sulfate 324 (65 FE) MG Tbec  Take 1 tablet by mouth daily.     finasteride 5 MG tablet  Commonly known as:  PROSCAR  Take 5 mg by mouth daily.     Fluticasone-Salmeterol 250-50 MCG/DOSE Aepb  Commonly known as:  ADVAIR  Inhale 1 puff into the lungs 2 (two) times daily.     gabapentin 300 MG capsule  Commonly known as:  NEURONTIN  Take 300 mg by mouth at bedtime.  ipratropium-albuterol 0.5-2.5 (3) MG/3ML Soln  Commonly known as:  DUONEB  Take 3 mLs by nebulization every 4 (four) hours as needed.     lisinopril 10 MG tablet  Commonly known as:  PRINIVIL,ZESTRIL  Take 10 mg by mouth daily.     pravastatin 40 MG tablet  Commonly known as:  PRAVACHOL  Take 40 mg by mouth daily.     ranitidine 75 MG tablet  Commonly known as:  ZANTAC  Take 75 mg by mouth daily.        Allergies: No Known Allergies  Family History: Family History  Problem Relation Age of Onset  . Brain cancer Father   . Lung cancer Father     Social History:  reports that he has been smoking  Pipe.  He does not have any smokeless tobacco history on file. He reports that he does not drink alcohol. His drug history is not on file.   Physical Exam: BP 157/80 mmHg  Pulse 67  Ht 5\' 8"  (1.727 m)  Wt 146 lb 9.6 oz (66.497 kg)  BMI 22.30 kg/m2  Constitutional:  Alert and oriented, No acute distress. HEENT: George AT, moist mucus membranes.  Trachea midline, no masses. Cardiovascular: No clubbing, cyanosis, or edema. Respiratory: Normal respiratory effort, no increased work of breathing. GI: Abdomen is soft, nontender, nondistended, no abdominal masses GU: No CVA tenderness. Normal phallus with orthotopic meatus.   Skin: No rashes, bruises or suspicious lesions. Neurologic: Grossly intact, no focal deficits, moving all 4 extremities. Psychiatric: Normal mood and affect.  Laboratory Data: Lab Results  Component Value Date   WBC 9.9 12/07/2014   HGB 10.1* 12/07/2014   HCT 31.8* 12/07/2014   MCV 86 12/07/2014   PLT 336 12/07/2014    Lab Results  Component Value Date   CREATININE 1.54* 12/07/2014     Urinalysis UA today with 6-10 with blood cells per HPF, no red blood cells, 2+ protein, nitrate negative, bacteria present.  Results for orders placed or performed in visit on 06/15/15  BLADDER SCAN AMB NON-IMAGING  Result Value Ref Range   Scan Result 194      Cystoscopy Procedure Note  Patient identification was confirmed, informed consent was obtained, and patient was prepped using Betadine solution.  Lidocaine jelly was administered per urethral meatus.    Preoperative abx where received prior to procedure.     Pre-Procedure: - Inspection reveals a normal caliber ureteral meatus.  Procedure: The flexible cystoscope was introduced without difficulty - No urethral strictures/lesions are present. - Enlarged prostate with trilobar coaptation, friable mucosa, mild bleeding with manipulation. - Elevated bladder neck - Bilateral ureteral orifices identified - Bladder  mucosa  reveals no ulcers, tumors, or lesions - No bladder stones - Moderate to severe trabeculation  Retroflexion shows massive median lobe pushing up and the base of the bladder  Post-Procedure: - Patient tolerated the procedure well  Assessment & Plan: 80 year old male status post cystoscopy today for surveillance following left nephroureterostomy. Cystoscopy negative other than for massive prostate.  1. Urothelial carcinoma of kidney, left S/p L laparoscopic nephroureterectomy 12/25016. Surgical pathology showed a low-grade superficial papillary ( inverted architecture) without evidence of invasion. Single lymph node was negative for malignancy. pTaN0Mx. -plan for repeat cysto on 3 months - Urinalysis, Complete - lidocaine (XYLOCAINE) 2 % jelly 1 application; Place 1 application into the urethra once. - ciprofloxacin (CIPRO) tablet 500 mg; Take 1 tablet (500 mg total) by mouth once.  2. Benign prostatic hypertrophy (BPH) with incomplete  bladder emptying Massively enlarged prostate, ultimately asymptomatic with baseline incomplete bladder emptying. PVR today less than 200 which is stable. Doing well otherwise without infections, stones, or other sequela. - BLADDER SCAN AMB NON-IMAGING  3. Elevated PSA S/p prostate biopsy on 07/18/14 which was negative for malignancy in setting of elevated PSA to 17.7 (07/03/14), repeat 7.7 ng/dL in 02/10/15. TRUS vol 149 cc with large median lobe noted.  Recent PSA consistent. with size of gland, will continue to follow.   -recheck PSA annually   Return in about 3 months (around 09/15/2015) for cystoscopy.  [name], MD  [facility] [address] [place], [place] [address] [phone]

## 2015-07-04 ENCOUNTER — Telehealth: Payer: Self-pay | Admitting: *Deleted

## 2015-07-04 NOTE — Telephone Encounter (Signed)
Dr. Inez Pilgrim had left a note for Eric Mullins about pt's that need to be followed up on. This pt was kidney cancer and s/p surgery 11/2014 and I in basket Scheduler to make appt for him. Tia called the pt and this message pt said:  Called patient to make f/u appt and patient stated that his primary doctor told him everything was fine and he no longer needed to f/u here. Patient no longer wants to make appointments here at the [facility].Called patient to make f/u appt and patient stated that his primary doctor told him everything was fine and he no longer needed to f/u here. Patient no longer wants to make appointments here at the [facility].

## 2015-07-09 ENCOUNTER — Other Ambulatory Visit: Payer: Self-pay | Admitting: Family Medicine

## 2015-07-09 MED ORDER — IPRATROPIUM-ALBUTEROL 0.5-2.5 (3) MG/3ML IN SOLN: 3.0000 mL | RESPIRATORY_TRACT | Status: AC | PRN

## 2015-08-16 ENCOUNTER — Ambulatory Visit: Payer: Self-pay | Admitting: Family Medicine

## 2015-08-28 ENCOUNTER — Ambulatory Visit: Payer: Self-pay | Admitting: Family Medicine

## 2015-08-30 ENCOUNTER — Encounter: Payer: Self-pay | Admitting: Internal Medicine

## 2015-08-30 ENCOUNTER — Ambulatory Visit: Payer: Self-pay

## 2015-08-30 ENCOUNTER — Inpatient Hospital Stay: Payer: PPO | Attending: Family Medicine | Admitting: Internal Medicine

## 2015-08-30 VITALS — BP 160/62 | HR 62 | Temp 97.0°F | Resp 18 | Ht 68.0 in | Wt 148.8 lb

## 2015-08-30 DIAGNOSIS — Z79899 Other long term (current) drug therapy: Secondary | ICD-10-CM | POA: Diagnosis not present

## 2015-08-30 DIAGNOSIS — Z7982 Long term (current) use of aspirin: Secondary | ICD-10-CM | POA: Diagnosis not present

## 2015-08-30 DIAGNOSIS — D649 Anemia, unspecified: Secondary | ICD-10-CM | POA: Diagnosis not present

## 2015-08-30 DIAGNOSIS — Z8553 Personal history of malignant neoplasm of renal pelvis: Secondary | ICD-10-CM | POA: Insufficient documentation

## 2015-08-30 DIAGNOSIS — Z905 Acquired absence of kidney: Secondary | ICD-10-CM

## 2015-08-30 DIAGNOSIS — Z85820 Personal history of malignant melanoma of skin: Secondary | ICD-10-CM | POA: Diagnosis not present

## 2015-08-30 DIAGNOSIS — F17210 Nicotine dependence, cigarettes, uncomplicated: Secondary | ICD-10-CM | POA: Diagnosis not present

## 2015-08-30 DIAGNOSIS — R0602 Shortness of breath: Secondary | ICD-10-CM | POA: Diagnosis not present

## 2015-08-30 DIAGNOSIS — K088 Other specified disorders of teeth and supporting structures: Secondary | ICD-10-CM | POA: Insufficient documentation

## 2015-08-30 DIAGNOSIS — C659 Malignant neoplasm of unspecified renal pelvis: Secondary | ICD-10-CM

## 2015-08-30 DIAGNOSIS — C669 Malignant neoplasm of unspecified ureter: Secondary | ICD-10-CM

## 2015-08-30 NOTE — Progress Notes (Signed)
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  Patient states that he has been doing fairly well. Denies any unusual fatigue or shortness of breath. He has chronic shortness of breath which is attributed to this smoking. He unfortunately continues to smoke.   Patient denies any blood in urine. He denies any major symptoms of urinary obstruction or increased urgency of urination. He is awaiting a repeat cystoscopy later in the fall of 2016   REVIEW OF SYSTEMS:     All other systems were reviewed with the patient and are negative.  I have reviewed the past medical history, past surgical history, social history and family history with the patient and they are unchanged from previous note unless stated above.  ALLERGIES:  has No Known Allergies.  MEDICATIONS:  Current Outpatient Prescriptions  Medication Sig Dispense Refill  . ferrous sulfate 324 (65 FE) MG TBEC Take 1 tablet by mouth daily.    . finasteride (PROSCAR) 5 MG tablet Take 5 mg by mouth daily.    . Fluticasone-Salmeterol (ADVAIR) 250-50 MCG/DOSE AEPB Inhale 1 puff into the lungs 2 (two) times daily.    Marland Kitchen gabapentin (NEURONTIN) 300 MG capsule Take 300 mg by mouth at bedtime.    Marland Kitchen ipratropium-albuterol (DUONEB) 0.5-2.5 (3) MG/3ML SOLN Take 3 mLs by nebulization every 4 (four) hours as needed. 360 mL 12  . lisinopril (PRINIVIL,ZESTRIL) 10 MG  tablet Take 10 mg by mouth daily.    . pravastatin (PRAVACHOL) 40 MG tablet Take 40 mg by mouth daily.    . ranitidine (ZANTAC) 75 MG tablet Take 75 mg by mouth 2 (two) times daily.    Marland Kitchen aspirin 81 MG tablet Take 81 mg by mouth daily.     No current facility-administered medications for this visit.    PHYSICAL EXAMINATION:   BP 160/62 mmHg  Pulse 62  Temp(Src) 97 F (36.1 C) (Tympanic)  Resp 18  Ht 5\' 8"  (1.727 m)  Wt 148 lb 13 oz (67.501 kg)  BMI 22.63 kg/m2  SpO2 99%  Filed Weights   08/30/15 0944  Weight: 148 lb 13 oz (67.501 kg)    GENERAL:alert, no distress and comfortable. Accompanied by his wife. EYES: normal, Conjunctiva are pink and non-injected, sclera clear OROPHARYNX: Poor dentition/ulceration. NECK: No thyromegaly LYMPH:  no palpable lymphadenopathy in the cervical, axillary or inguinal LUNGS: clear to auscultation with normal breathing effort; No Wheeze or crackles  Cardio-vascular- Regular Rate & Rythm and no murmurs and no lower extremity edema ABDOMEN:abdomen soft, non-tender and normal bowel sounds; No hepato-splenomegaly.  Musculoskeletal:no cyanosis of digits and no clubbing  NEURO: alert & oriented x 3 with fluent speech, no focal motor/sensory deficits. SKIN: no skin rash   LABORATORY DATA:  I have reviewed the data as listed    Component Value Date/Time   NA 139 11/22/2014 0400   K 4.4 11/22/2014 0400   CL 109* 11/22/2014 0400   CO2 24  11/22/2014 0400   GLUCOSE 110* 11/22/2014 0400   BUN 16 11/22/2014 0400   CREATININE 1.54* 12/07/2014 1420   CALCIUM 7.9* 11/22/2014 0400   GFRNONAA 47* 12/07/2014 1420   GFRNONAA >60 07/02/2014 1228   GFRAA 57* 12/07/2014 1420   GFRAA >60 07/02/2014 1228    No results found for: SPEP, UPEP  Lab Results  Component Value Date   WBC 9.9 12/07/2014   NEUTROABS 7.3* 12/07/2014   HGB 10.1* 12/07/2014   HCT 31.8* 12/07/2014   MCV 86 12/07/2014   PLT 336 12/07/2014      Chemistry      Component Value  Date/Time   NA 139 11/22/2014 0400   K 4.4 11/22/2014 0400   CL 109* 11/22/2014 0400   CO2 24 11/22/2014 0400   BUN 16 11/22/2014 0400   CREATININE 1.54* 12/07/2014 1420      Component Value Date/Time   CALCIUM 7.9* 11/22/2014 0400       RADIOGRAPHIC STUDIES: I have personally reviewed the radiological images as listed and agreed with the findings in the report. No results found.   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 Clinically no evidence of recurrence  #2 chronic anemia- secondary to prior hematuria/ question mild renal insufficiency creatinine around 1.5. Patient reluctant to have any labs done today. He states that he had labs done recently at other doctor's office. We will try to get the records.  Orders Placed This Encounter  Procedures  . Comprehensive metabolic panel    Standing Status: Future     Number of Occurrences:      Standing Expiration Date: 08/29/2016  . CBC with Differential    Standing Status: Future     Number of Occurrences:      Standing Expiration Date: 08/29/2016   All questions were answered. The patient knows to call the clinic with any problems, questions or concerns. No barriers to learning was detected. I spent 15 minutes counseling the patient face to face. The total time spent in the appointment was 40 minutes and more than 50% was on counseling and review of test results     Cammie Sickle, MD 08/30/2015 10:39 AM

## 2015-08-30 NOTE — Progress Notes (Signed)
Elevated PPT at 45- mixing study in JAN 2016- s/o FACTOR DEFICIENCY; no further work up at this time as he is asymptomatic at this time. Monitor for now.

## 2015-10-30 ENCOUNTER — Telehealth: Payer: Self-pay | Admitting: Urology

## 2015-10-30 NOTE — Telephone Encounter (Signed)
Please advise 

## 2015-10-30 NOTE — Telephone Encounter (Signed)
Pt called stating that he has a cystoscopy scheduled for 11/09/15.  Wants to XC appt because he "cannot afford" it based on the last cystoscopy he had.  Pt states that he had a balance of over $300 after insurance covered the procedure last time and he cannot afford it.  Can you please call the patient to discuss other options.

## 2015-11-02 ENCOUNTER — Other Ambulatory Visit: Payer: Self-pay | Admitting: Urology

## 2015-11-09 ENCOUNTER — Ambulatory Visit (INDEPENDENT_AMBULATORY_CARE_PROVIDER_SITE_OTHER): Payer: PPO | Admitting: Urology

## 2015-11-09 ENCOUNTER — Encounter: Payer: Self-pay | Admitting: Urology

## 2015-11-09 VITALS — BP 171/54 | HR 64 | Ht 68.0 in | Wt 148.0 lb

## 2015-11-09 DIAGNOSIS — C642 Malignant neoplasm of left kidney, except renal pelvis: Secondary | ICD-10-CM | POA: Diagnosis not present

## 2015-11-09 DIAGNOSIS — R3914 Feeling of incomplete bladder emptying: Secondary | ICD-10-CM

## 2015-11-09 DIAGNOSIS — N401 Enlarged prostate with lower urinary tract symptoms: Secondary | ICD-10-CM

## 2015-11-09 DIAGNOSIS — R972 Elevated prostate specific antigen [PSA]: Secondary | ICD-10-CM

## 2015-11-09 LAB — URINALYSIS, COMPLETE
BILIRUBIN UA: NEGATIVE
Glucose, UA: NEGATIVE
Ketones, UA: NEGATIVE
Nitrite, UA: NEGATIVE
RBC, UA: NEGATIVE
Specific Gravity, UA: 1.015 (ref 1.005–1.030)
UUROB: 0.2 mg/dL (ref 0.2–1.0)
pH, UA: 6 (ref 5.0–7.5)

## 2015-11-09 LAB — MICROSCOPIC EXAMINATION
RBC MICROSCOPIC, UA: NONE SEEN /HPF (ref 0–?)
RENAL EPITHEL UA: NONE SEEN /HPF

## 2015-11-09 MED ORDER — CIPROFLOXACIN HCL 500 MG PO TABS
500.0000 mg | ORAL_TABLET | Freq: Once | ORAL | Status: AC
  Administered 2015-11-09: 500 mg via ORAL

## 2015-11-09 MED ORDER — LIDOCAINE HCL 2 % EX GEL
1.0000 "application " | Freq: Once | CUTANEOUS | Status: AC
  Administered 2015-11-09: 1 via URETHRAL

## 2015-11-09 NOTE — Progress Notes (Signed)
2:05 PM  11/09/2015   Eric Mullins 1935/01/29 [id number]  Referring provider: Arlis Porta., MD [id number] [place], [place] [address]  Chief Complaint  Patient presents with  . Cysto    HPI: 80 yo M with history of gross hematuria, elevated PSA, and left upper tract TCC s/p lap nephroU 11/2014.  He was  found to have left upper pole filling defect suspicious for upper tract TCC on CT Urogram 06/2014. Further workup of this lesion revealed a papillary upper pole lesion consistent with upper tract TCC. Given the inability to easily survey this lesion endoscopically, he underwent left hand-assisted laparoscopic nephroureterectomy on 11/20/2014.   Surgical pathology showed a low-grade superficial papillary ( inverted architecture) without evidence of invasion. Single lymph node was negative for malignancy. pTaN0Mx  He underwent prostate biopsy on 07/18/14 which was negative for malignancy in setting of elevated PSA to 17.7 (07/03/14), repeat 7.7 ng/dL in 02/10/15. TRUS vol 149 cc with large median lobe noted.   Cystoscopic exam shows a large median lobe with moderate bladder trabeculation, no bladder masses and he is currently undergoing q3 month cytoscopy.    Today, the patient denies any significant urinary symptoms. He reports that he is overall been voiding well.  He returns today for repeat cysto.   PMH: Past Medical History  Diagnosis Date  . Hypertension   . Hyperlipidemia   . COPD (chronic obstructive pulmonary disease) ([place])   . GERD (gastroesophageal reflux disease)   . Urinary tract infection   . Paraphimosis   . Gross hematuria   . Kidney filling defect   . BPH (benign prostatic hyperplasia)   . Encounter for removal of urinary catheter   . Primary cancer of left renal pelvis ([place])   . Elevated PSA   . Incomplete bladder emptying     Surgical History: Past Surgical History  Procedure Laterality Date  . Ear mastoidectomy w/ cochlear implant  w/ landmark    . Back surgery    . Total knee arthroplasty Left   . Elbow surgery Left     Home Medications:    Medication List       This list is accurate as of: 11/09/15  2:05 PM.  Always use your most recent med list.               aspirin 81 MG tablet  Take 81 mg by mouth daily.     ferrous sulfate 324 (65 FE) MG Tbec  Take 1 tablet by mouth daily.     finasteride 5 MG tablet  Commonly known as:  PROSCAR  Take 5 mg by mouth daily.     Fluticasone-Salmeterol 250-50 MCG/DOSE Aepb  Commonly known as:  ADVAIR  Inhale 1 puff into the lungs 2 (two) times daily.     gabapentin 300 MG capsule  Commonly known as:  NEURONTIN  Take 300 mg by mouth at bedtime.     ipratropium-albuterol 0.5-2.5 (3) MG/3ML Soln  Commonly known as:  DUONEB  Take 3 mLs by nebulization every 4 (four) hours as needed.     lisinopril 10 MG tablet  Commonly known as:  PRINIVIL,ZESTRIL  Take 10 mg by mouth daily.     ranitidine 75 MG tablet  Commonly known as:  ZANTAC  Take 75 mg by mouth 2 (two) times daily.        Allergies: No Known Allergies  Family History: Family History  Problem Relation Age of Onset  . Brain  cancer Father   . Lung cancer Father     Social History:  reports that he has been smoking Pipe.  He has never used smokeless tobacco. He reports that he does not drink alcohol or use illicit drugs.   Physical Exam: BP 171/54 mmHg  Pulse 64  Ht 5\' 8"  (1.727 m)  Wt 148 lb (67.132 kg)  BMI 22.51 kg/m2  Constitutional:  Alert and oriented, No acute distress. HEENT: [place] AT, moist mucus membranes.  Trachea midline, no masses. Cardiovascular: No clubbing, cyanosis, or edema. Respiratory: Normal respiratory effort, no increased work of breathing. GI: Abdomen is soft, nontender, nondistended, no abdominal masses GU: No CVA tenderness. Normal phallus with orthotopic meatus.   Skin: No rashes, bruises or suspicious lesions. Neurologic: Grossly intact, no focal deficits,  moving all 4 extremities. Psychiatric: Normal mood and affect.  Laboratory Data: Lab Results  Component Value Date   WBC 9.9 12/07/2014   HGB 10.1* 12/07/2014   HCT 31.8* 12/07/2014   MCV 86 12/07/2014   PLT 336 12/07/2014    Lab Results  Component Value Date   CREATININE 1.54* 12/07/2014     Urinalysis   Cystoscopy Procedure Note  Patient identification was confirmed, informed consent was obtained, and patient was prepped using Betadine solution.  Lidocaine jelly was administered per urethral meatus.    Preoperative abx where received prior to procedure.     Pre-Procedure: - Inspection reveals a normal caliber ureteral meatus.  Procedure: The flexible cystoscope was introduced without difficulty - No urethral strictures/lesions are present. - Enlarged prostate with trilobar coaptation, friable mucosa, mild bleeding with manipulation. - Elevated bladder neck - Bilateral ureteral orifices identified - Bladder mucosa  reveals no ulcers, tumors, or lesions - No bladder stones - Moderate to severe trabeculation  Retroflexion shows massive median lobe pushing up and the base of the bladder, small area of adherent calcification on prostatic median lobe surface without any obvious underlying tumor.  Post-Procedure: - Patient tolerated the procedure well  Assessment & Plan: 80 year old male status post cystoscopy today for surveillance following left nephroureterostomy. Cystoscopy negative other than for massive prostate and small prostatic calcification.  1. Urothelial carcinoma of kidney, left S/p L laparoscopic nephroureterectomy 11/2014. Surgical pathology showed a low-grade superficial papillary ( inverted architecture) without evidence of invasion. Single lymph node was negative for malignancy. pTaN0Mx. Will continue to monitor calcification on surface of median lobe of prostate.   -plan for repeat cysto on 3-4 months -RUS prior to next visit of right kidney -  Urinalysis, Complete - lidocaine (XYLOCAINE) 2 % jelly 1 application; Place 1 application into the urethra once. - ciprofloxacin (CIPRO) tablet 500 mg; Take 1 tablet (500 mg total) by mouth once.  2. Benign prostatic hypertrophy (BPH) with incomplete bladder emptying Massively enlarged prostate, ultimately asymptomatic with baseline incomplete bladder emptying and history of stable mildly elevated PVR ~200cc. Doing well otherwise without infections, stones, or other sequela. - BLADDER SCAN AMB NON-IMAGING  3. Elevated PSA S/p prostate biopsy on 07/18/14 which was negative for malignancy in setting of elevated PSA to 17.7 (07/03/14), repeat 7.7 ng/dL in 02/10/15. TRUS vol 149 cc with large median lobe noted.  Recent PSA consistent. with size of gland, will continue to follow.   -recheck PSA annually, due at next visit   Return in about 4 months (around 03/09/2016) for cystoscopy.  [name], MD  [facility] [address], [place], [place] [address] [phone]

## 2015-11-09 NOTE — Addendum Note (Signed)
Addended by: Wilson Singer on: 11/09/2015 04:56 PM   Modules accepted: Orders

## 2015-12-21 ENCOUNTER — Encounter: Payer: Self-pay | Admitting: Family Medicine

## 2016-01-09 DIAGNOSIS — H6123 Impacted cerumen, bilateral: Secondary | ICD-10-CM | POA: Diagnosis not present

## 2016-01-09 DIAGNOSIS — H93293 Other abnormal auditory perceptions, bilateral: Secondary | ICD-10-CM | POA: Diagnosis not present

## 2016-01-30 DIAGNOSIS — I10 Essential (primary) hypertension: Secondary | ICD-10-CM | POA: Diagnosis not present

## 2016-02-27 ENCOUNTER — Inpatient Hospital Stay: Payer: PPO | Admitting: Internal Medicine

## 2016-02-27 ENCOUNTER — Encounter: Payer: Self-pay | Admitting: *Deleted

## 2016-02-27 ENCOUNTER — Other Ambulatory Visit: Payer: Self-pay | Admitting: Internal Medicine

## 2016-02-27 ENCOUNTER — Inpatient Hospital Stay: Payer: PPO

## 2016-02-27 DIAGNOSIS — D509 Iron deficiency anemia, unspecified: Secondary | ICD-10-CM

## 2016-03-01 ENCOUNTER — Encounter: Payer: Self-pay | Admitting: Emergency Medicine

## 2016-03-01 ENCOUNTER — Emergency Department
Admission: EM | Admit: 2016-03-01 | Discharge: 2016-03-01 | Disposition: A | Discharge status: Home / Self Care | Payer: PPO | Attending: Emergency Medicine | Admitting: Emergency Medicine

## 2016-03-01 DIAGNOSIS — Z7982 Long term (current) use of aspirin: Secondary | ICD-10-CM | POA: Insufficient documentation

## 2016-03-01 DIAGNOSIS — Z9629 Presence of other otological and audiological implants: Secondary | ICD-10-CM | POA: Diagnosis not present

## 2016-03-01 DIAGNOSIS — Z79899 Other long term (current) drug therapy: Secondary | ICD-10-CM | POA: Diagnosis not present

## 2016-03-01 DIAGNOSIS — R93429 Abnormal radiologic findings on diagnostic imaging of unspecified kidney: Secondary | ICD-10-CM | POA: Diagnosis not present

## 2016-03-01 DIAGNOSIS — N400 Enlarged prostate without lower urinary tract symptoms: Secondary | ICD-10-CM | POA: Insufficient documentation

## 2016-03-01 DIAGNOSIS — E785 Hyperlipidemia, unspecified: Secondary | ICD-10-CM | POA: Diagnosis not present

## 2016-03-01 DIAGNOSIS — C652 Malignant neoplasm of left renal pelvis: Secondary | ICD-10-CM | POA: Insufficient documentation

## 2016-03-01 DIAGNOSIS — F17210 Nicotine dependence, cigarettes, uncomplicated: Secondary | ICD-10-CM | POA: Diagnosis not present

## 2016-03-01 DIAGNOSIS — R339 Retention of urine, unspecified: Secondary | ICD-10-CM

## 2016-03-01 DIAGNOSIS — R338 Other retention of urine: Secondary | ICD-10-CM | POA: Diagnosis not present

## 2016-03-01 DIAGNOSIS — I10 Essential (primary) hypertension: Secondary | ICD-10-CM | POA: Diagnosis not present

## 2016-03-01 DIAGNOSIS — Z466 Encounter for fitting and adjustment of urinary device: Secondary | ICD-10-CM | POA: Insufficient documentation

## 2016-03-01 DIAGNOSIS — J45909 Unspecified asthma, uncomplicated: Secondary | ICD-10-CM | POA: Insufficient documentation

## 2016-03-01 LAB — URINALYSIS COMPLETE WITH MICROSCOPIC (ARMC ONLY)
BACTERIA UA: NONE SEEN
BILIRUBIN URINE: NEGATIVE
Glucose, UA: NEGATIVE mg/dL
KETONES UR: NEGATIVE mg/dL
Leukocytes, UA: NEGATIVE
Nitrite: POSITIVE — AB
PH: 5 (ref 5.0–8.0)
PROTEIN: 30 mg/dL — AB
SQUAMOUS EPITHELIAL / LPF: NONE SEEN
Specific Gravity, Urine: 1.011 (ref 1.005–1.030)

## 2016-03-01 MED ORDER — CIPROFLOXACIN HCL 500 MG PO TABS: 500.0000 mg | ORAL_TABLET | Freq: Two times a day (BID) | ORAL | Status: DC

## 2016-03-01 NOTE — ED Provider Notes (Signed)
[facility] Emergency Department Provider Note  ____________________________________________  Time seen: Approximately 4:39 PM  I have reviewed the triage vital signs and the nursing notes.   HISTORY  Chief Complaint Urinary Retention    HPI Eric Mullins is a 81 y.o. male , NAD, presents to the emergency department with approximately 12 hours of urinary retention. States he has not been able to void urine since approximately midnight last night. He has a history of cancer in which he intermittently self catheterizes if he has urinary retention. He attempted to catheterize himself last night 3-4 times with no success. His wife is accompanying him and notes that the patient has not had the catheter in some time. Patient notes he had some lower abdominal pain and pressure due to bladder being full and has noted complete resolution of that pain and pressure since being catheterized in this emergency department. Denies any fever, chills, body aches. Has not been fatigued. Denies any dysuria, hematuria, urinary frequency or urgency. Has had no urinary retention or hesitancy and in the last few months until last night.   Past Medical History  Diagnosis Date  . Hypertension   . Hyperlipidemia   . COPD (chronic obstructive pulmonary disease) ([place])   . GERD (gastroesophageal reflux disease)   . Urinary tract infection   . Paraphimosis   . Gross hematuria   . Kidney filling defect   . BPH (benign prostatic hyperplasia)   . Encounter for removal of urinary catheter   . Primary cancer of left renal pelvis ([place])   . Elevated PSA   . Incomplete bladder emptying     There are no active problems to display for this patient.   Past Surgical History  Procedure Laterality Date  . Ear mastoidectomy w/ cochlear implant w/ landmark    . Back surgery    . Total knee arthroplasty Left   . Elbow surgery Left     Current Outpatient Rx  Name  Route  Sig  Dispense   Refill  . amLODipine (NORVASC) 5 MG tablet   Oral   Take 5 mg by mouth daily.         Marland Kitchen aspirin 81 MG tablet   Oral   Take 81 mg by mouth daily.         . ciprofloxacin (CIPRO) 500 MG tablet   Oral   Take 1 tablet (500 mg total) by mouth 2 (two) times daily.   20 tablet   0   . ferrous sulfate 324 (65 FE) MG TBEC   Oral   Take 1 tablet by mouth daily.         . finasteride (PROSCAR) 5 MG tablet   Oral   Take 5 mg by mouth daily.         . Fluticasone-Salmeterol (ADVAIR) 250-50 MCG/DOSE AEPB   Inhalation   Inhale 1 puff into the lungs 2 (two) times daily.         Marland Kitchen gabapentin (NEURONTIN) 300 MG capsule   Oral   Take 300 mg by mouth at bedtime.         Marland Kitchen ipratropium-albuterol (DUONEB) 0.5-2.5 (3) MG/3ML SOLN   Nebulization   Take 3 mLs by nebulization every 4 (four) hours as needed.   360 mL   12   . lisinopril (PRINIVIL,ZESTRIL) 10 MG tablet   Oral   Take 10 mg by mouth daily.         . metoprolol tartrate (LOPRESSOR) 25 MG  tablet   Oral   Take 25 mg by mouth 2 (two) times daily.         . pravastatin (PRAVACHOL) 40 MG tablet   Oral   Take 40 mg by mouth daily.         . ranitidine (ZANTAC) 75 MG tablet   Oral   Take 75 mg by mouth 2 (two) times daily.         . tamsulosin (FLOMAX) 0.4 MG CAPS capsule   Oral   Take 0.4 mg by mouth daily after supper.         . triamcinolone cream (KENALOG) 0.1 %   Topical   Apply 1 application topically 2 (two) times daily.           Allergies Review of patient's allergies indicates no known allergies.  Family History  Problem Relation Age of Onset  . Brain cancer Father   . Lung cancer Father     Social History Social History  Substance Use Topics  . Smoking status: Current Every Day Smoker    Types: Pipe  . Smokeless tobacco: Never Used  . Alcohol Use: No     Review of Systems Constitutional: No fever/chills, fatigue Cardiovascular: No chest pain. Respiratory:  No shortness  of breath. No wheezing.  Gastrointestinal: Positive lower abdominal pressure.  No nausea, vomiting.  No diarrhea.  No constipation. Genitourinary: Positive urinary hesitancy and retention. Negative for dysuria, hematuria. No urinary rgency or increased frequency. Musculoskeletal: Negative for back pain.  Skin: Negative for rash. Neurological: Negative for headaches, focal weakness or numbness. 10-point ROS otherwise negative.  ____________________________________________   PHYSICAL EXAM:  VITAL SIGNS: ED Triage Vitals  Enc Vitals Group     BP 03/01/16 1535 187/69 mmHg     Pulse Rate 03/01/16 1535 70     Resp 03/01/16 1535 18     Temp 03/01/16 1535 97.7 F (36.5 C)     Temp Source 03/01/16 1535 Oral     SpO2 03/01/16 1535 98 %     Weight --      Height --      Head Cir --      Peak Flow --      Pain Score 03/01/16 1539 3     Pain Loc --      Pain Edu? --      Excl. in [place]? --     Constitutional: Alert and oriented. Well appearing and in no acute distress. Eyes: Conjunctivae are normal.  Head: Atraumatic and normocephalic. Hematological/Lymphatic/Immunilogical: No cervical lymphadenopathy. Cardiovascular: Normal rate, regular rhythm. Grossly normal S1 and S2.   Respiratory: Normal respiratory effort without tachypnea or retractions. Lungs CTAB with breath sounds noted throughout all lung fields. Gastrointestinal: Soft and nontender in all quadrants. No distention, guarding.  Neurologic:  Normal speech and language. No gross focal neurologic deficits are appreciated.  Skin:  Skin is warm, dry and intact. No rash noted. Psychiatric: Mood and affect are normal. Speech and behavior are normal. Patient exhibits appropriate insight and judgement.   ____________________________________________   LABS (all labs ordered are listed, but only abnormal results are displayed)  Labs Reviewed  URINALYSIS COMPLETEWITH MICROSCOPIC ([facility] ONLY) - Abnormal; Notable for the following:     Color, Urine YELLOW (*)    APPearance CLOUDY (*)    Hgb urine dipstick 3+ (*)    Protein, ur 30 (*)    Nitrite POSITIVE (*)    All other components within normal limits  URINE CULTURE  ____________________________________________  EKG  None ____________________________________________  RADIOLOGY  None ____________________________________________    PROCEDURES  Procedure(s) performed: None    Medications - No data to display   ____________________________________________   INITIAL IMPRESSION / ASSESSMENT AND PLAN / ED COURSE  Pertinent lab results that were available during my care of the patient were reviewed by me and considered in my medical decision making (see chart for details).  Patient's diagnosis is consistent with urinary retention with Foley catheter placement. Anticipate blood seen on urinalysis is related to patient's attempts to self catheter at home. Patient will be discharged home with prescriptions for Cipro to take as directed to empirically cover for potential urinary tract infection. Urine culture has been ordered and results will be called to the patient when available. Patient is to follow up with his urologist Dr. [name] if symptoms persist past this treatment course. Patient is given ED precautions to return to the ED for any worsening or new symptoms.    ____________________________________________  FINAL CLINICAL IMPRESSION(S) / ED DIAGNOSES  Final diagnoses:  Urinary retention  Encounter for Foley catheter replacement      NEW MEDICATIONS STARTED DURING THIS VISIT:  Discharge Medication List as of 03/01/2016  4:55 PM    START taking these medications   Details  ciprofloxacin (CIPRO) 500 MG tablet Take 1 tablet (500 mg total) by mouth 2 (two) times daily., Starting 03/01/2016, Until Discontinued, [place], PA-C 03/01/16 1725  Daymon Larsen, MD 03/01/16 [phone]

## 2016-03-01 NOTE — ED Notes (Signed)
Patient last voided at midnight last night. Patient self caths intermittently due to renal cancer and subsequent removal.

## 2016-03-01 NOTE — ED Notes (Signed)
Discussed discharge instructions, prescriptions, and follow-up care with patient. No questions or concerns at this time. Pt stable at discharge.  

## 2016-03-01 NOTE — Discharge Instructions (Signed)
Acute Urinary Retention, Male  Acute urinary retention is when you are unable to pee (urinate). Acute urinary retention is common in older men. Prostates can get bigger, which blocks the flow of pee.   HOME CARE  · Drink enough fluids to keep your pee clear or pale yellow.  · If you are sent home with a tube that drains the bladder (catheter), there will be a drainage bag attached to it. There are two types of bags. One is big that you can wear at night without having to empty it. One is smaller and needs to be emptied more often.    Keep the drainage bag empty.    Keep the drainage bag lower than your catheter.  · Only take medicine as told by your doctor.  GET HELP IF:  · You have a low-grade fever.  · You have spasms or you are leaking pee when you have spasms.  GET HELP RIGHT AWAY IF:   · You have chills or a fever.  · Your catheter stops draining pee.  · Your catheter falls out.  · You have increased bleeding that does not stop after you have rested and increased the amount of fluids you had been drinking.  MAKE SURE YOU:   · Understand these instructions.  · Will watch your condition.  · Will get help right away if you are not doing well or get worse.     This information is not intended to replace advice given to you by your health care provider. Make sure you discuss any questions you have with your health care provider.     Document Released: 05/12/2008 Document Revised: 04/10/2015 Document Reviewed: 05/05/2013  Elsevier Interactive Patient Education ©2016 [employer]

## 2016-03-05 LAB — URINE CULTURE

## 2016-03-06 ENCOUNTER — Telehealth: Payer: Self-pay

## 2016-03-06 ENCOUNTER — Encounter: Payer: Self-pay | Admitting: Urology

## 2016-03-06 ENCOUNTER — Ambulatory Visit (INDEPENDENT_AMBULATORY_CARE_PROVIDER_SITE_OTHER): Payer: PPO | Admitting: Urology

## 2016-03-06 VITALS — BP 148/61 | HR 66 | Ht 71.0 in | Wt 142.1 lb

## 2016-03-06 DIAGNOSIS — N400 Enlarged prostate without lower urinary tract symptoms: Secondary | ICD-10-CM

## 2016-03-06 DIAGNOSIS — C649 Malignant neoplasm of unspecified kidney, except renal pelvis: Secondary | ICD-10-CM | POA: Insufficient documentation

## 2016-03-06 DIAGNOSIS — C642 Malignant neoplasm of left kidney, except renal pelvis: Secondary | ICD-10-CM

## 2016-03-06 DIAGNOSIS — N401 Enlarged prostate with lower urinary tract symptoms: Secondary | ICD-10-CM | POA: Insufficient documentation

## 2016-03-06 DIAGNOSIS — R338 Other retention of urine: Principal | ICD-10-CM

## 2016-03-06 DIAGNOSIS — R972 Elevated prostate specific antigen [PSA]: Secondary | ICD-10-CM

## 2016-03-06 MED ORDER — TAMSULOSIN HCL 0.4 MG PO CAPS: 0.4000 mg | ORAL_CAPSULE | Freq: Every day | ORAL | Status: DC

## 2016-03-06 MED ORDER — FINASTERIDE 5 MG PO TABS: 5.0000 mg | ORAL_TABLET | Freq: Every day | ORAL | Status: DC

## 2016-03-06 NOTE — Progress Notes (Signed)
Emergency Department Discharge Culture Review:  81 yo M with ED visit 03/01/16 for urinary retention.  Patient discharged with Ciprofloxacin Rx.  03/01/16:  Urine cx results: >100,000 Aerococcus Urinae. No susceptibilities run by lab.  Per Discussion with Dr. Burlene Arnt and review of information/case reports concerning Aerococcus urinae and possible resistance to fluroquinolones and Bactrim (usually susceptible to penicillins and vancomycin), will adjust antibiotics to Amoxicillin 500mg  po q12h x 4 days. Original discussion was for 10 days tx but adjusted to 4 days per MD order.  Rx for Amoxicillin called in to [facility] in [place]. [phone]  Discussed with patient to stop Cipro and start new antibiotic. Patient was asked about fevers, chills, weakness. Patient denies any of these symptoms. Ph # [phone]  Chinita Greenland PharmD Clinical Pharmacist 03/06/2016 3:44 PM

## 2016-03-06 NOTE — Progress Notes (Signed)
03/06/2016 9:42 AM   Eric Mullins 1935-04-09 [id number]  Referring provider: Arlis Porta., MD [place], [place] [address]  Chief Complaint  Patient presents with  . Urinary Retention    Foley removal    HPI: Patient is a 81 year old Caucasian male who presents today for foley catheter removal.    Patient had been managing his urinary retention with CIC, but he states he has not had to cath himself in several months.  He stated he was having good volume output and an adequate stream.  He was not experiencing dysuria, gross hematuria or suprapubic pain in the days prior to his retention episode.  He stated he urinated fine before bed and then he woke up to urinate and couldn't pass his urine.  He tried to catheterized himself at home to no avail.  He felt the catheter tube reach a certain point and it was like hitting a wall and the tube would kink.  He tried about four times and home.  He stopped when he started to see blood.    He then went to the ED for catheter placement on 03/01/3016.  A foley was placed and patient states a large volume of urine was returned.  He was placed on Cipro and instructed to follow up with [place].  Urine culture was positive for aerococcus urinae.    He is taking the tamsulosin, but he is not taking the finasteride.    He is scheduled for follow up with Dr. Erlene Quan on 03/11/2016 for a RUS report and cystoscopy.  He is status post left nephroureterectomy in 11/2014.   Surgical pathology showed a low-grade superficial papillary ( inverted architecture) without evidence of invasion. Single lymph node was negative for malignancy. pTaN0Mx.  PMH: Past Medical History  Diagnosis Date  . Hypertension   . Hyperlipidemia   . COPD (chronic obstructive pulmonary disease) ([place])   . GERD (gastroesophageal reflux disease)   . Urinary tract infection   . Paraphimosis   . Gross hematuria   . Kidney filling defect   . BPH (benign prostatic  hyperplasia)   . Encounter for removal of urinary catheter   . Primary cancer of left renal pelvis ([place])   . Elevated PSA   . Incomplete bladder emptying     Surgical History: Past Surgical History  Procedure Laterality Date  . Ear mastoidectomy w/ cochlear implant w/ landmark    . Back surgery    . Total knee arthroplasty Left   . Elbow surgery Left     Home Medications:    Medication List       This list is accurate as of: 03/06/16  9:42 AM.  Always use your most recent med list.               amLODipine 5 MG tablet  Commonly known as:  NORVASC  Take 5 mg by mouth daily.     aspirin 81 MG tablet  Take 81 mg by mouth daily.     ciprofloxacin 500 MG tablet  Commonly known as:  CIPRO  Take 1 tablet (500 mg total) by mouth 2 (two) times daily.     ferrous sulfate 324 (65 Fe) MG Tbec  Take 1 tablet by mouth daily.     finasteride 5 MG tablet  Commonly known as:  PROSCAR  Take 1 tablet (5 mg total) by mouth daily.     Fluticasone-Salmeterol 250-50 MCG/DOSE Aepb  Commonly known  as:  ADVAIR  Inhale 1 puff into the lungs 2 (two) times daily.     gabapentin 300 MG capsule  Commonly known as:  NEURONTIN  Take 300 mg by mouth at bedtime. Reported on 03/06/2016     ipratropium-albuterol 0.5-2.5 (3) MG/3ML Soln  Commonly known as:  DUONEB  Take 3 mLs by nebulization every 4 (four) hours as needed.     lisinopril 10 MG tablet  Commonly known as:  PRINIVIL,ZESTRIL  Take 10 mg by mouth daily.     metoprolol tartrate 25 MG tablet  Commonly known as:  LOPRESSOR  Take 25 mg by mouth 2 (two) times daily.     pravastatin 40 MG tablet  Commonly known as:  PRAVACHOL  Take 40 mg by mouth daily.     PROAIR HFA 108 (90 Base) MCG/ACT inhaler  Generic drug:  albuterol     ranitidine 75 MG tablet  Commonly known as:  ZANTAC  Take 75 mg by mouth 2 (two) times daily. Reported on 03/06/2016     rOPINIRole 1 MG tablet  Commonly known as:  REQUIP     tamsulosin 0.4 MG Caps  capsule  Commonly known as:  FLOMAX  Take 1 capsule (0.4 mg total) by mouth daily after supper.     triamcinolone cream 0.1 %  Commonly known as:  KENALOG  Apply 1 application topically 2 (two) times daily.        Allergies: No Known Allergies  Family History: Family History  Problem Relation Age of Onset  . Brain cancer Father   . Lung cancer Father   . Kidney disease Neg Hx   . Prostate cancer Neg Hx     Social History:  reports that he has been smoking Pipe.  He has never used smokeless tobacco. He reports that he does not drink alcohol or use illicit drugs.  ROS: UROLOGY Frequent Urination?: No Hard to postpone urination?: No Burning/pain with urination?: No Get up at night to urinate?: Yes Leakage of urine?: No Urine stream starts and stops?: No Trouble starting stream?: No Do you have to strain to urinate?: No Blood in urine?: Yes Urinary tract infection?: Yes Sexually transmitted disease?: No Injury to kidneys or bladder?: No Painful intercourse?: No Weak stream?: No Erection problems?: No Penile pain?: No  Gastrointestinal Nausea?: No Vomiting?: No Indigestion/heartburn?: No Diarrhea?: No Constipation?: No  Constitutional Fever: No Night sweats?: No Weight loss?: No Fatigue?: No  Skin Skin rash/lesions?: No Itching?: No  Eyes Blurred vision?: No Double vision?: No  Ears/Nose/Throat Sore throat?: No Sinus problems?: No  Hematologic/Lymphatic Swollen glands?: No Easy bruising?: No  Cardiovascular Leg swelling?: No Chest pain?: No  Respiratory Cough?: No Shortness of breath?: No  Endocrine Excessive thirst?: No  Musculoskeletal Back pain?: No Joint pain?: No  Neurological Headaches?: No Dizziness?: No  Psychologic Depression?: No Anxiety?: No  Physical Exam: BP 148/61 mmHg  Pulse 66  Ht 5\' 11"  (1.803 m)  Wt 142 lb 1.6 oz (64.456 kg)  BMI 19.83 kg/m2  Constitutional: Well nourished. Alert and oriented, No acute  distress. HEENT: [place] AT, moist mucus membranes. Trachea midline, no masses. Cardiovascular: No clubbing, cyanosis, or edema. Respiratory: Normal respiratory effort, no increased work of breathing. Skin: No rashes, bruises or suspicious lesions. Lymph: No cervical or inguinal adenopathy. Neurologic: Grossly intact, no focal deficits, moving all 4 extremities. Psychiatric: Normal mood and affect.  Laboratory Data: Lab Results  Component Value Date   WBC 9.9 12/07/2014   HGB 10.1*  12/07/2014   HCT 31.8* 12/07/2014   MCV 86 12/07/2014   PLT 336 12/07/2014    Lab Results  Component Value Date   CREATININE 1.54* 12/07/2014   Assessment & Plan:   1. Benign prostatic hypertrophy (BPH) with retention:   Patient's foley is removed today for a voiding trial.  He still has the supplies at home for CIC.  He will report back to the office today by 3:30 pm if he should experience any difficulty in voiding.  He will continue the tamsulosin.  I have restarted the finasteride, but he has a massively enlarged prostate.  I do not know if this will add much benefit in the short term, as the patient is unsure on how long he has been without the medication.  He will be undergoing a cystoscopy when he returns next week to monitor calcification on surface of median lobe of prostate.  2. Urothelial carcinoma of kidney, left S/p L laparoscopic nephroureterectomy 11/2014. Surgical pathology showed a low-grade superficial papillary ( inverted architecture) without evidence of invasion. Single lymph node was negative for malignancy. pTaN0Mx. Will continue to monitor calcification on surface of median lobe of prostate.  Patient returning for cystoscopy next week.  RUS prior to next visit of right kidney  3. Elevated PSA S/p prostate biopsy on 07/18/14 which was negative for malignancy in setting of elevated PSA to 17.7 (07/03/14), repeat 7.7 ng/dL in 02/10/15. TRUS vol 149 cc with large median lobe noted.  Recent PSA consistent. with size of gland, will continue to follow.  Patient's PSA is not drawn today due to recent catheter placement.     Return for keep follow up appointment with Dr. Erlene Quan.  These notes generated with voice recognition software. I apologize for typographical errors.  Zara Council, [facility] [address], [place], [place] [address] [phone]

## 2016-03-06 NOTE — Progress Notes (Signed)
Catheter Removal  Patient is present today for a catheter removal.  78ml of water was drained from the balloon. A 16FR foley cath was removed from the bladder no complications were noted . Patient tolerated well.  Preformed by: Lyndee Hensen CMA  Follow up/ Additional notes: patient and wife instructed to drink fluids and to return to office by 3:00 pm if he can not void.

## 2016-03-06 NOTE — Telephone Encounter (Signed)
Pharmacist, Cyril Mourning, from [facility] called in reference to pt urine cx from 03/01/16. Per Cyril Mourning when pt was in the ED for urinary retention a ucx was completed. The organism that grew was an unusual organism and therefore no sensitivities were completed. Cyril Mourning stated she did some research on case studies and found out organism is usually sensitive to penicillins therefore pt was put on amoxicillin today. On 03/01/16 cipro was given and d/c'd when amoxicillin initiated.

## 2016-03-07 ENCOUNTER — Ambulatory Visit (INDEPENDENT_AMBULATORY_CARE_PROVIDER_SITE_OTHER): Payer: PPO

## 2016-03-07 DIAGNOSIS — N401 Enlarged prostate with lower urinary tract symptoms: Secondary | ICD-10-CM

## 2016-03-07 DIAGNOSIS — N400 Enlarged prostate without lower urinary tract symptoms: Secondary | ICD-10-CM

## 2016-03-07 DIAGNOSIS — R338 Other retention of urine: Principal | ICD-10-CM

## 2016-03-07 LAB — BLADDER SCAN AMB NON-IMAGING: Scan Result: >807

## 2016-03-07 MED ORDER — AMOXICILLIN 500 MG PO CAPS: 500.0000 mg | ORAL_CAPSULE | Freq: Two times a day (BID) | ORAL | Status: DC

## 2016-03-07 NOTE — Progress Notes (Signed)
Simple Catheter Placement  Due to urinary retention patient is present today for a foley cath placement.  Patient was cleaned and prepped in a sterile fashion with betadine and lidocaine jelly 2% was instilled into the urethra.  A 16 FR coude foley catheter was inserted, urine return was noted  833ml, urine was yellow in color.  The balloon was filled with 10cc of sterile water.  A leg bag was attached for drainage. Patient was also given a night bag to take home and was given instruction on how to change from one bag to another.  Patient was given instruction on proper catheter care.  Patient tolerated well, no complications were noted   Preformed by: Toniann Fail, LPN   Additional notes/ Follow up: pt walked in stating he was in a lot of pain and not able to urinate but just a drops since voiding trial yesterday. Bladder scan was >807. Per Larene Beach catheter was placed. Pt will keep foley until cysto appt with Dr. Erlene Quan. Per Larene Beach pt should continue amoxicllin until cysto also. Pt voiced understanding of all.

## 2016-03-10 ENCOUNTER — Ambulatory Visit
Admission: RE | Admit: 2016-03-10 | Discharge: 2016-03-10 | Disposition: A | Discharge status: Home / Self Care | Payer: PPO | Source: Ambulatory Visit | Attending: Urology | Admitting: Urology

## 2016-03-10 DIAGNOSIS — R93421 Abnormal radiologic findings on diagnostic imaging of right kidney: Secondary | ICD-10-CM | POA: Insufficient documentation

## 2016-03-10 DIAGNOSIS — N281 Cyst of kidney, acquired: Secondary | ICD-10-CM | POA: Diagnosis not present

## 2016-03-10 DIAGNOSIS — R310 Gross hematuria: Secondary | ICD-10-CM | POA: Diagnosis not present

## 2016-03-10 DIAGNOSIS — N400 Enlarged prostate without lower urinary tract symptoms: Secondary | ICD-10-CM | POA: Diagnosis not present

## 2016-03-10 DIAGNOSIS — C642 Malignant neoplasm of left kidney, except renal pelvis: Secondary | ICD-10-CM | POA: Diagnosis not present

## 2016-03-10 NOTE — Telephone Encounter (Signed)
Medication was sent to pharmacy and pt was made aware while in office.

## 2016-03-10 NOTE — Telephone Encounter (Signed)
Patient needs to be on the amoxicillin until his cystoscopy is completed.

## 2016-03-11 ENCOUNTER — Other Ambulatory Visit: Payer: PPO | Admitting: Urology

## 2016-03-11 ENCOUNTER — Ambulatory Visit (INDEPENDENT_AMBULATORY_CARE_PROVIDER_SITE_OTHER): Payer: PPO | Admitting: Urology

## 2016-03-11 VITALS — BP 180/62 | HR 66 | Ht 68.0 in | Wt 147.0 lb

## 2016-03-11 DIAGNOSIS — R339 Retention of urine, unspecified: Secondary | ICD-10-CM

## 2016-03-11 DIAGNOSIS — C642 Malignant neoplasm of left kidney, except renal pelvis: Secondary | ICD-10-CM

## 2016-03-11 DIAGNOSIS — R338 Other retention of urine: Principal | ICD-10-CM

## 2016-03-11 DIAGNOSIS — N400 Enlarged prostate without lower urinary tract symptoms: Secondary | ICD-10-CM | POA: Diagnosis not present

## 2016-03-11 DIAGNOSIS — N401 Enlarged prostate with lower urinary tract symptoms: Secondary | ICD-10-CM

## 2016-03-11 LAB — BLADDER SCAN AMB NON-IMAGING: SCAN RESULT: 438

## 2016-03-11 MED ORDER — LIDOCAINE HCL 2 % EX GEL
1.0000 "application " | Freq: Once | CUTANEOUS | Status: AC
  Administered 2016-03-11: 1 via URETHRAL

## 2016-03-11 NOTE — Progress Notes (Signed)
3:37 PM  03/11/2016   Eric Mullins 08-01-1935 [id number]  Referring provider: Arlis Porta., MD [id number] [place], [place] [address]  Chief Complaint  Patient presents with  . 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  He is currently in retention and being treated for Aerococcus UTI on ampicillin.    History of upper tract TCC  He was  found to have left upper pole filling defect suspicious for upper tract TCC on CT Urogram 06/2014. Further workup of this lesion revealed a papillary upper pole lesion consistent with upper tract TCC. Given the inability to easily survey this lesion endoscopically, he underwent left hand-assisted laparoscopic nephroureterectomy on 11/20/2014.   Surgical pathology showed a low-grade superficial papillary ( inverted architecture) without evidence of invasion. Single lymph node was negative for malignancy. pTaN0Mx  NED.  Returns today for cysto. Last cysto prior to today 11/2015 negative.   Surveillance RUS wnl on 03/10/16 with stable complex cyst 6.4 cm.    Elevated PSA He underwent prostate biopsy on 07/18/14 which was negative for malignancy in setting of elevated PSA to 17.7 (07/03/14), repeat 7.7 ng/dL in 02/10/15. TRUS vol 149 cc with large median lobe noted.   Cystoscopic exam shows a large median lobe with moderate bladder trabeculation.  Urinary retention Developed urinary retention 03/01/16, failed VT x 2.  Currently on flomax/ finasteride.  Unable to self cath due to prostate anatomy.     PMH: Past Medical History  Diagnosis Date  . Hypertension   . Hyperlipidemia   . COPD (chronic obstructive pulmonary disease) ([place])   . GERD (gastroesophageal reflux disease)   . Urinary tract infection   . Paraphimosis   . Gross hematuria   . Kidney filling defect   . BPH (benign prostatic hyperplasia)   . Encounter for removal of urinary catheter   .  Primary cancer of left renal pelvis ([place])   . Elevated PSA   . Incomplete bladder emptying     Surgical History: Past Surgical History  Procedure Laterality Date  . Ear mastoidectomy w/ cochlear implant w/ landmark    . Back surgery    . Total knee arthroplasty Left   . Elbow surgery Left     Home Medications:    Medication List       This list is accurate as of: 03/11/16  3:37 PM.  Always use your most recent med list.               amLODipine 5 MG tablet  Commonly known as:  NORVASC  Take 5 mg by mouth daily.     amoxicillin 500 MG capsule  Commonly known as:  AMOXIL  Take 1 capsule (500 mg total) by mouth 2 (two) times daily.     aspirin 81 MG tablet  Take 81 mg by mouth daily.     EYE HEALTH Caps  Take by mouth.     ferrous sulfate 324 (65 Fe) MG Tbec  Take 1 tablet by mouth daily.     finasteride 5 MG tablet  Commonly known as:  PROSCAR  Take 1 tablet (5 mg total) by mouth daily.     Fluticasone-Salmeterol 250-50 MCG/DOSE Aepb  Commonly known as:  ADVAIR  Inhale 1 puff into the lungs 2 (two) times daily.     gabapentin 300 MG capsule  Commonly known as:  NEURONTIN  Take 300 mg by mouth at bedtime. Reported on 03/06/2016     ipratropium-albuterol 0.5-2.5 (3) MG/3ML Soln  Commonly known as:  DUONEB  Take 3 mLs by nebulization every 4 (four) hours as needed.     lisinopril 10 MG tablet  Commonly known as:  PRINIVIL,ZESTRIL  Take 10 mg by mouth daily.     metoprolol tartrate 25 MG tablet  Commonly known as:  LOPRESSOR  Take 25 mg by mouth 2 (two) times daily.     pravastatin 40 MG tablet  Commonly known as:  PRAVACHOL  Take 40 mg by mouth daily.     PROAIR HFA 108 (90 Base) MCG/ACT inhaler  Generic drug:  albuterol     ranitidine 75 MG tablet  Commonly known as:  ZANTAC  Take 75 mg by mouth 2 (two) times daily. Reported on 03/06/2016     rOPINIRole 1 MG tablet  Commonly known as:  REQUIP     tamsulosin 0.4 MG Caps capsule  Commonly known  as:  FLOMAX  Take 1 capsule (0.4 mg total) by mouth daily after supper.     triamcinolone cream 0.1 %  Commonly known as:  KENALOG  Apply 1 application topically 2 (two) times daily.        Allergies: No Known Allergies  Family History: Family History  Problem Relation Age of Onset  . Brain cancer Father   . Lung cancer Father   . Kidney disease Neg Hx   . Prostate cancer Neg Hx     Social History:  reports that he has been smoking Pipe.  He has never used smokeless tobacco. He reports that he does not drink alcohol or use illicit drugs.   Physical Exam: BP 180/62 mmHg  Pulse 66  Ht 5\' 8"  (1.727 m)  Wt 147 lb (66.679 kg)  BMI 22.36 kg/m2  Constitutional:  Alert and oriented, No acute distress. HEENT: [place] AT, moist mucus membranes.  Trachea midline, no masses. Cardiovascular: No clubbing, cyanosis, or edema. Respiratory: Normal respiratory effort, no increased work of breathing. GI: Abdomen is soft, nontender, nondistended, no abdominal masses GU: No CVA tenderness. Normal phallus with orthotopic meatus.   Skin: No rashes, bruises or suspicious lesions. Neurologic: Grossly intact, no focal deficits, moving all 4 extremities. Psychiatric: Normal mood and affect.  Laboratory Data: Lab Results  Component Value Date   WBC 9.9 12/07/2014   HGB 10.1* 12/07/2014   HCT 31.8* 12/07/2014   MCV 86 12/07/2014   PLT 336 12/07/2014    Lab Results  Component Value Date   CREATININE 1.54* 12/07/2014     Cystoscopy Procedure Note  Patient identification was confirmed, informed consent was obtained, and patient was prepped using Betadine solution.  Lidocaine jelly was administered per urethral meatus.    Preoperative abx where received prior to procedure.     Pre-Procedure: - Inspection reveals a normal caliber ureteral meatus.  Procedure: The flexible cystoscope was introduced without difficulty - No urethral strictures/lesions are present. - Enlarged prostate with  trilobar coaptation, friable mucosa, mild bleeding with manipulation. - Elevated bladder neck - Bilateral ureteral orifices identified - Bladder mucosa  reveals no ulcers, tumors, or lesions - No bladder stones - Moderate to severe trabeculation  Retroflexion shows massive median lobe pushing up and the base of the bladder  Post-Procedure: - Patient tolerated the procedure well  Assessment & Plan: 81 year old male status post cystoscopy today for surveillance following left nephroureterostomy. Cystoscopy negative other than for massive prostate and small prostatic  calcification.  1. Urothelial carcinoma of kidney, left S/p L laparoscopic nephroureterectomy 11/2014. Surgical pathology showed a low-grade superficial papillary ( inverted architecture) without evidence of invasion. Single lymph node was negative for malignancy. pTaN0Mx.  NED.   -plan for repeat cysto on 3-4 months - Urinalysis, Complete - lidocaine (XYLOCAINE) 2 % jelly 1 application; Place 1 application into the urethra once. - ciprofloxacin (CIPRO) tablet 500 mg; Take 1 tablet (500 mg total) by mouth once.  2. Benign prostatic hypertrophy (BPH) with incomplete bladder emptying/ urinary retention Massively enlarged prostate, Baseline incomplete bladder emptying most recently with urinary retention. He failed several voiding trials including today. 150 cc prostate with massive median lobe.    Alternatives discussed today at length. He is unable to self catheter due to prostatic anatomy. We discussed results options including open simple prostatectomy, robotic simple prostatectomy, holmium laser enucleation of the prostate, end-stage TURP given size of gland. He is most interested in holmium laser enucleation of the prostate which is a minimally invasive technique. I have advised him, there is a risk of failure of the procedure to resolve his retention and incomplete bladder emptying, worsening of his irritative voiding  symptoms including frequency and urgency, and risk of urinary incontinence both urge and stress which typically resolve that may persist. He also understands was of bleeding, infection, damage to surrounding structures including the bladder, retrograde ejaculation, amongst others. He will need a Foley catheter for several days postop.  He also discussed proceeding with right retrograde pyelogram to assess his right upper tract given his history of upper tract urothelial carcinoma. He is agreeable with this.  He will need to stop ASA 5 days prior to the procedure.    Repeat preop urine culture to ensure resolution of his infection.   3. Elevated PSA S/p prostate biopsy on 07/18/14 which was negative for malignancy in setting of elevated PSA to 17.7 (07/03/14), repeat 7.7 ng/dL in 02/10/15. TRUS vol 149 cc with large median lobe noted.  Recent PSA consistent. with size of gland, will continue to follow.   -recheck PSA annually, defer recheck today given current Foley/ retention   Schedule HoLEP, cysto, right RTG   [name], MD  [facility] [address] [place], [place] [address] [phone]

## 2016-03-11 NOTE — Progress Notes (Signed)
Simple Catheter Placement  Due to urinary retention patient is present today for a foley cath placement.  Patient was cleaned and prepped in a sterile fashion with betadine and lidocaine jelly 2% was instilled into the urethra.  A 16 FR coude foley catheter was inserted, urine return was noted  490ml, urine was light yellow in color.  The balloon was filled with 10cc of sterile water.  A leg bag was attached for drainage. Patient was also given a night bag to take home and was given instruction on how to change from one bag to another.  Patient was given instruction on proper catheter care.  Patient tolerated well, no complications were noted   Preformed by: Toniann Fail, LPN

## 2016-03-12 ENCOUNTER — Telehealth: Payer: Self-pay | Admitting: Radiology

## 2016-03-12 NOTE — Telephone Encounter (Signed)
Notified pt's wife, Inez Catalina, of surgery scheduled 04/07/16 and surgical clearance appt with Dr Neoma Laming on 03/14/16 @2 :00. Inez Catalina voices understanding.

## 2016-03-13 NOTE — Telephone Encounter (Signed)
Notified pt's wife, Inez Catalina, of pre-admit testing appt on 03/24/16 @9 :00 and to call Friday prior to surgery for arrival time to SDS. Inez Catalina voices understanding.

## 2016-03-14 DIAGNOSIS — I2510 Atherosclerotic heart disease of native coronary artery without angina pectoris: Secondary | ICD-10-CM | POA: Diagnosis not present

## 2016-03-14 DIAGNOSIS — I10 Essential (primary) hypertension: Secondary | ICD-10-CM | POA: Diagnosis not present

## 2016-03-14 DIAGNOSIS — E782 Mixed hyperlipidemia: Secondary | ICD-10-CM | POA: Diagnosis not present

## 2016-03-14 DIAGNOSIS — I509 Heart failure, unspecified: Secondary | ICD-10-CM | POA: Diagnosis not present

## 2016-03-14 NOTE — Telephone Encounter (Signed)
Notified pt's wife, Inez Catalina, that per Dr April Manson instructions pt is to hold ASA 81mg  x 2 tablets 5 days prior to surgery beginning on 04/02/16 & is to resume 2 days after surgery if there are no signs of bleeding. Inez Catalina voices understanding.

## 2016-03-24 ENCOUNTER — Encounter
Admission: RE | Admit: 2016-03-24 | Discharge: 2016-03-24 | Disposition: A | Discharge status: Home / Self Care | Payer: PPO | Source: Ambulatory Visit | Attending: Urology | Admitting: Urology

## 2016-03-24 DIAGNOSIS — Z01812 Encounter for preprocedural laboratory examination: Secondary | ICD-10-CM | POA: Insufficient documentation

## 2016-03-24 DIAGNOSIS — N400 Enlarged prostate without lower urinary tract symptoms: Secondary | ICD-10-CM | POA: Diagnosis not present

## 2016-03-24 DIAGNOSIS — Z859 Personal history of malignant neoplasm, unspecified: Secondary | ICD-10-CM | POA: Diagnosis not present

## 2016-03-24 HISTORY — DX: Cardiac murmur, unspecified: R01.1

## 2016-03-24 HISTORY — DX: Cerebral infarction, unspecified: I63.9

## 2016-03-24 LAB — BASIC METABOLIC PANEL
Anion gap: 5 (ref 5–15)
BUN: 23 mg/dL — AB (ref 6–20)
CALCIUM: 9.3 mg/dL (ref 8.9–10.3)
CO2: 25 mmol/L (ref 22–32)
Chloride: 109 mmol/L (ref 101–111)
Creatinine, Ser: 1.14 mg/dL (ref 0.61–1.24)
GFR calc Af Amer: >60 mL/min (ref >60)
GFR, EST NON AFRICAN AMERICAN: 60 mL/min — AB (ref >60)
GLUCOSE: 94 mg/dL (ref 65–99)
Potassium: 4.5 mmol/L (ref 3.5–5.1)
SODIUM: 139 mmol/L (ref 135–145)

## 2016-03-24 LAB — CBC
HCT: 45.7 % (ref 40.0–52.0)
HEMOGLOBIN: 15.2 g/dL (ref 13.0–18.0)
MCH: 29.9 pg (ref 26.0–34.0)
MCHC: 33.3 g/dL (ref 32.0–36.0)
MCV: 89.7 fL (ref 80.0–100.0)
PLATELETS: 136 10*3/uL — AB (ref 150–440)
RBC: 5.09 MIL/uL (ref 4.40–5.90)
RDW: 14 % (ref 11.5–14.5)
WBC: 9.6 10*3/uL (ref 3.8–10.6)

## 2016-03-24 NOTE — Patient Instructions (Signed)
  Your procedure is scheduled on: Monday Apr 07, 2016. Report to Same Day Surgery. To find out your arrival time please call [phone] between 1PM - 3PM on Friday April 04, 2016.  Remember: Instructions that are not followed completely may result in serious medical risk, up to and including death, or upon the discretion of your surgeon and anesthesiologist your surgery may need to be rescheduled.    _x___ 1. Do not eat food or drink liquids after midnight. No gum chewing or hard candies.     ____ 2. No Alcohol for 24 hours before or after surgery.   ____ 3. Bring all medications with you on the day of surgery if instructed.    __x__ 4. Notify your doctor if there is any change in your medical condition     (cold, fever, infections).     Do not wear jewelry, make-up, hairpins, clips or nail polish.  Do not wear lotions, powders, or perfumes. You may wear deodorant.  Do not shave 48 hours prior to surgery. Men may shave face and neck.  Do not bring valuables to the hospital.    [facility] is not responsible for any belongings or valuables.               Contacts, dentures or bridgework may not be worn into surgery.  Leave your suitcase in the car. After surgery it may be brought to your room.  For patients admitted to the hospital, discharge time is determined by your treatment team.   Patients discharged the day of surgery will not be allowed to drive home.    Please read over the following fact sheets that you were given:   [facility] Preparing for Surgery  _x___ Take these medicines the morning of surgery with A SIP OF WATER:    1. metoprolol tartrate (LOPRESSOR  2. ranitidine (ZANTAC)    ____ Fleet Enema (as directed)   ____ Use CHG Soap as directed on instruction sheet  _x__ Use inhalers on the day of surgery and bring to hospital day of surgery  ____ Stop metformin 2 days prior to surgery    ____ Take 1/2 of usual insulin dose the night before surgery and none on  the morning of   surgery.   __x__ Stop aspirin 5 days prior to surgery per Dr. Laurelyn Sickle instructions.  _x___ Stop Anti-inflammatories such as Advil, Aleve, Ibuprofen, Motrin, Naproxen, Naprosyn, Goodies powders or aspirin products. Ok to take Tylenol.   ____ Stop supplements until after surgery.    ____ Bring C-Pap to the hospital.

## 2016-03-28 ENCOUNTER — Ambulatory Visit: Payer: PPO

## 2016-03-28 ENCOUNTER — Telehealth: Payer: Self-pay | Admitting: Radiology

## 2016-03-28 ENCOUNTER — Telehealth: Payer: Self-pay | Admitting: Urology

## 2016-03-28 DIAGNOSIS — N390 Urinary tract infection, site not specified: Secondary | ICD-10-CM | POA: Diagnosis not present

## 2016-03-28 LAB — URINE CULTURE: SPECIAL REQUESTS: NORMAL

## 2016-03-28 LAB — MICROSCOPIC EXAMINATION
Bacteria, UA: NONE SEEN
WBC UA: NONE SEEN /HPF (ref 0–?)

## 2016-03-28 LAB — URINALYSIS, COMPLETE
Bilirubin, UA: NEGATIVE
GLUCOSE, UA: NEGATIVE
KETONES UA: NEGATIVE
NITRITE UA: NEGATIVE
SPEC GRAV UA: 1.01 (ref 1.005–1.030)
UUROB: 0.2 mg/dL (ref 0.2–1.0)
pH, UA: 5.5 (ref 5.0–7.5)

## 2016-03-28 MED ORDER — NITROFURANTOIN MONOHYD MACRO 100 MG PO CAPS: 100.0000 mg | ORAL_CAPSULE | Freq: Every day | ORAL | Status: DC

## 2016-03-28 NOTE — Telephone Encounter (Signed)
Patient's wife called this morning to report that the patient's urine catheter bag has turned a bright red in color over the last 3-4 days and she is concerned. She also added that he has completed his round of antibiotics.  He is scheduled for HOLEP, cysto and retrograde on 04/07/2016.  She can be reached at [phone].

## 2016-03-28 NOTE — Pre-Procedure Instructions (Signed)
Dr. Erlene Quan aware of urine culture results.  Being treated with Macrobid.

## 2016-03-28 NOTE — Telephone Encounter (Signed)
Spoke with pt wife in reference to pt have gross hematuria in foley. Reinforced with wife blood can be normal for a foley and encouraged wife to have pt drink plenty of fluids to help flush the system. Per Dr. Erlene Quan pt should come by and give a urine specimen to make sure there is no infection present prior to surgery. Pt will RTC this afternoon for specimen. Wife voiced understanding.

## 2016-03-28 NOTE — Telephone Encounter (Signed)
-----   Message from [name], MD sent at 03/28/2016  3:36 PM EDT ----- Arloa Koh go ahead and have Mr. Neudecker take Macrobid 100 mg daily though surgical date as a precaution.  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    [name], MD

## 2016-03-28 NOTE — Telephone Encounter (Signed)
Pt's wife, Eric Mullins, notified of prescription for Macrobid 100mg  daily sent to pharmacy. Pt is to take 1 tablet daily until surgery scheduled 04/07/16 as a precaution. Eric Mullins voices understanding.

## 2016-03-28 NOTE — Addendum Note (Signed)
Addended by: Wilson Singer on: 03/28/2016 04:07 PM   Modules accepted: Orders

## 2016-03-28 NOTE — Progress Notes (Signed)
Pt came in today per Dr. Erlene Quan to give a cath specimen. Pt cath was plugged for 20 min and specimen was obtained. Urine was sent for u/a and ucx.

## 2016-03-29 ENCOUNTER — Telehealth: Payer: Self-pay | Admitting: Urology

## 2016-03-29 NOTE — Telephone Encounter (Signed)
I have called the prescription for Macrobid 100 mg daily to [place] and had to leave it on the voice mail.  I have contact Mr. Eric Mullins and he will pick up the prescription Monday.

## 2016-03-31 ENCOUNTER — Telehealth: Payer: Self-pay | Admitting: Radiology

## 2016-03-31 LAB — CULTURE, URINE COMPREHENSIVE

## 2016-03-31 NOTE — Telephone Encounter (Signed)
Notified Inez Catalina, pt's wife, that prior authorization was obtained for nitrofurantoin prescription so they should be able to get it from the pharmacy now. Inez Catalina voices understanding.

## 2016-04-07 ENCOUNTER — Ambulatory Visit: Payer: PPO | Admitting: Anesthesiology

## 2016-04-07 ENCOUNTER — Encounter
Admission: RE | Disposition: A | Discharge status: Home / Self Care | Payer: Self-pay | Source: Ambulatory Visit | Attending: Urology

## 2016-04-07 ENCOUNTER — Observation Stay
Admission: RE | Admit: 2016-04-07 | Discharge: 2016-04-08 | Disposition: A | Discharge status: Home / Self Care | Payer: PPO | Source: Ambulatory Visit | Attending: Urology | Admitting: Urology

## 2016-04-07 ENCOUNTER — Encounter: Payer: Self-pay | Admitting: *Deleted

## 2016-04-07 ENCOUNTER — Observation Stay: Payer: PPO | Admitting: Certified Registered"

## 2016-04-07 DIAGNOSIS — I351 Nonrheumatic aortic (valve) insufficiency: Secondary | ICD-10-CM | POA: Insufficient documentation

## 2016-04-07 DIAGNOSIS — L309 Dermatitis, unspecified: Secondary | ICD-10-CM | POA: Diagnosis not present

## 2016-04-07 DIAGNOSIS — N320 Bladder-neck obstruction: Secondary | ICD-10-CM | POA: Diagnosis not present

## 2016-04-07 DIAGNOSIS — M5417 Radiculopathy, lumbosacral region: Secondary | ICD-10-CM | POA: Diagnosis not present

## 2016-04-07 DIAGNOSIS — E785 Hyperlipidemia, unspecified: Secondary | ICD-10-CM | POA: Insufficient documentation

## 2016-04-07 DIAGNOSIS — Z96652 Presence of left artificial knee joint: Secondary | ICD-10-CM | POA: Diagnosis not present

## 2016-04-07 DIAGNOSIS — R338 Other retention of urine: Secondary | ICD-10-CM | POA: Diagnosis not present

## 2016-04-07 DIAGNOSIS — M545 Low back pain: Secondary | ICD-10-CM | POA: Diagnosis not present

## 2016-04-07 DIAGNOSIS — K219 Gastro-esophageal reflux disease without esophagitis: Secondary | ICD-10-CM | POA: Insufficient documentation

## 2016-04-07 DIAGNOSIS — R310 Gross hematuria: Secondary | ICD-10-CM | POA: Diagnosis not present

## 2016-04-07 DIAGNOSIS — R001 Bradycardia, unspecified: Secondary | ICD-10-CM | POA: Diagnosis not present

## 2016-04-07 DIAGNOSIS — F17200 Nicotine dependence, unspecified, uncomplicated: Secondary | ICD-10-CM | POA: Insufficient documentation

## 2016-04-07 DIAGNOSIS — C674 Malignant neoplasm of posterior wall of bladder: Principal | ICD-10-CM | POA: Insufficient documentation

## 2016-04-07 DIAGNOSIS — N401 Enlarged prostate with lower urinary tract symptoms: Secondary | ICD-10-CM | POA: Insufficient documentation

## 2016-04-07 DIAGNOSIS — I959 Hypotension, unspecified: Secondary | ICD-10-CM | POA: Insufficient documentation

## 2016-04-07 DIAGNOSIS — N400 Enlarged prostate without lower urinary tract symptoms: Secondary | ICD-10-CM | POA: Diagnosis not present

## 2016-04-07 DIAGNOSIS — Q796 Ehlers-Danlos syndrome: Secondary | ICD-10-CM | POA: Diagnosis not present

## 2016-04-07 DIAGNOSIS — Z8553 Personal history of malignant neoplasm of renal pelvis: Secondary | ICD-10-CM | POA: Diagnosis not present

## 2016-04-07 DIAGNOSIS — G2581 Restless legs syndrome: Secondary | ICD-10-CM | POA: Diagnosis not present

## 2016-04-07 DIAGNOSIS — N99820 Postprocedural hemorrhage and hematoma of a genitourinary system organ or structure following a genitourinary system procedure: Secondary | ICD-10-CM | POA: Insufficient documentation

## 2016-04-07 DIAGNOSIS — R3914 Feeling of incomplete bladder emptying: Secondary | ICD-10-CM | POA: Diagnosis not present

## 2016-04-07 DIAGNOSIS — I10 Essential (primary) hypertension: Secondary | ICD-10-CM | POA: Insufficient documentation

## 2016-04-07 DIAGNOSIS — J449 Chronic obstructive pulmonary disease, unspecified: Secondary | ICD-10-CM | POA: Insufficient documentation

## 2016-04-07 DIAGNOSIS — D494 Neoplasm of unspecified behavior of bladder: Secondary | ICD-10-CM | POA: Diagnosis not present

## 2016-04-07 DIAGNOSIS — Z9842 Cataract extraction status, left eye: Secondary | ICD-10-CM | POA: Insufficient documentation

## 2016-04-07 DIAGNOSIS — Z9841 Cataract extraction status, right eye: Secondary | ICD-10-CM | POA: Diagnosis not present

## 2016-04-07 DIAGNOSIS — Z905 Acquired absence of kidney: Secondary | ICD-10-CM | POA: Diagnosis not present

## 2016-04-07 DIAGNOSIS — M5412 Radiculopathy, cervical region: Secondary | ICD-10-CM | POA: Diagnosis not present

## 2016-04-07 DIAGNOSIS — Z7982 Long term (current) use of aspirin: Secondary | ICD-10-CM | POA: Diagnosis not present

## 2016-04-07 HISTORY — PX: CYSTOSCOPY W/ RETROGRADES: SHX1426

## 2016-04-07 HISTORY — PX: HOLEP-LASER ENUCLEATION OF THE PROSTATE WITH MORCELLATION: SHX6641

## 2016-04-07 HISTORY — PX: CYSTOSCOPY: SHX5120

## 2016-04-07 LAB — ABO/RH: ABO/RH(D): AB POS

## 2016-04-07 LAB — BASIC METABOLIC PANEL
ANION GAP: 3 — AB (ref 5–15)
BUN: 21 mg/dL — ABNORMAL HIGH (ref 6–20)
CALCIUM: 8.6 mg/dL — AB (ref 8.9–10.3)
CO2: 25 mmol/L (ref 22–32)
Chloride: 110 mmol/L (ref 101–111)
Creatinine, Ser: 1.27 mg/dL — ABNORMAL HIGH (ref 0.61–1.24)
GFR, EST NON AFRICAN AMERICAN: 52 mL/min — AB (ref >60)
GLUCOSE: 127 mg/dL — AB (ref 65–99)
Potassium: 5.4 mmol/L — ABNORMAL HIGH (ref 3.5–5.1)
Sodium: 138 mmol/L (ref 135–145)

## 2016-04-07 LAB — CBC
HCT: 38.7 % — ABNORMAL LOW (ref 40.0–52.0)
Hemoglobin: 12.8 g/dL — ABNORMAL LOW (ref 13.0–18.0)
MCH: 29.1 pg (ref 26.0–34.0)
MCHC: 33 g/dL (ref 32.0–36.0)
MCV: 88.2 fL (ref 80.0–100.0)
PLATELETS: 144 10*3/uL — AB (ref 150–440)
RBC: 4.39 MIL/uL — ABNORMAL LOW (ref 4.40–5.90)
RDW: 14.1 % (ref 11.5–14.5)
WBC: 11.7 10*3/uL — AB (ref 3.8–10.6)

## 2016-04-07 LAB — HEMOGLOBIN AND HEMATOCRIT, BLOOD
HEMATOCRIT: 33.9 % — AB (ref 40.0–52.0)
Hemoglobin: 11.1 g/dL — ABNORMAL LOW (ref 13.0–18.0)

## 2016-04-07 SURGERY — CYSTOSCOPY
Anesthesia: General | Wound class: Clean Contaminated

## 2016-04-07 SURGERY — CYSTOSCOPY, WITH RETROGRADE PYELOGRAM
Anesthesia: General | Laterality: Right | Wound class: Clean Contaminated

## 2016-04-07 MED ORDER — ONDANSETRON HCL 4 MG/2ML IJ SOLN: 4.0000 mg | Freq: Once | INTRAMUSCULAR | Status: DC | PRN

## 2016-04-07 MED ORDER — ONDANSETRON HCL 4 MG/2ML IJ SOLN
INTRAMUSCULAR | Status: DC | PRN
  Administered 2016-04-07: 4 mg via INTRAVENOUS

## 2016-04-07 MED ORDER — CEFAZOLIN SODIUM 1 G IJ SOLR
INTRAMUSCULAR | Status: DC | PRN
  Administered 2016-04-07: 1 g via INTRAMUSCULAR

## 2016-04-07 MED ORDER — NEOSTIGMINE METHYLSULFATE 10 MG/10ML IV SOLN
INTRAVENOUS | Status: DC | PRN
  Administered 2016-04-07: 3 mg via INTRAVENOUS

## 2016-04-07 MED ORDER — PRAVASTATIN SODIUM 20 MG PO TABS
40.0000 mg | ORAL_TABLET | Freq: Every day | ORAL | Status: DC
  Administered 2016-04-07: 40 mg via ORAL
  Filled 2016-04-07: qty 2

## 2016-04-07 MED ORDER — METOPROLOL TARTRATE 25 MG PO TABS
25.0000 mg | ORAL_TABLET | Freq: Two times a day (BID) | ORAL | Status: DC
  Administered 2016-04-07 – 2016-04-08 (×2): 25 mg via ORAL
  Filled 2016-04-07 (×2): qty 1

## 2016-04-07 MED ORDER — ALBUMIN HUMAN 5 % IV SOLN
INTRAVENOUS | Status: AC
  Administered 2016-04-07: 12.5 g via INTRAVENOUS
  Filled 2016-04-07: qty 500

## 2016-04-07 MED ORDER — DOCUSATE SODIUM 100 MG PO CAPS
100.0000 mg | ORAL_CAPSULE | Freq: Two times a day (BID) | ORAL | Status: DC
  Administered 2016-04-07 – 2016-04-08 (×2): 100 mg via ORAL
  Filled 2016-04-07 (×2): qty 1

## 2016-04-07 MED ORDER — FENTANYL CITRATE (PF) 100 MCG/2ML IJ SOLN
INTRAMUSCULAR | Status: DC | PRN
  Administered 2016-04-07: 25 ug via INTRAVENOUS

## 2016-04-07 MED ORDER — LACTATED RINGERS IV SOLN
INTRAVENOUS | Status: DC
  Administered 2016-04-07 (×3): via INTRAVENOUS
  Administered 2016-04-07: 50 mL/h via INTRAVENOUS
  Administered 2016-04-07: 17:00:00 via INTRAVENOUS

## 2016-04-07 MED ORDER — FENTANYL CITRATE (PF) 100 MCG/2ML IJ SOLN: 25.0000 ug | INTRAMUSCULAR | Status: DC | PRN

## 2016-04-07 MED ORDER — LIDOCAINE HCL (CARDIAC) 20 MG/ML IV SOLN
INTRAVENOUS | Status: DC | PRN
  Administered 2016-04-07: 100 mg via INTRAVENOUS

## 2016-04-07 MED ORDER — PHENYLEPHRINE HCL 10 MG/ML IJ SOLN
10.0000 mg | INTRAVENOUS | Status: DC | PRN
  Administered 2016-04-07: 25 ug/min via INTRAVENOUS

## 2016-04-07 MED ORDER — PROPOFOL 10 MG/ML IV BOLUS
INTRAVENOUS | Status: DC | PRN
  Administered 2016-04-07: 120 mg via INTRAVENOUS

## 2016-04-07 MED ORDER — FENTANYL CITRATE (PF) 100 MCG/2ML IJ SOLN
INTRAMUSCULAR | Status: DC | PRN
  Administered 2016-04-07: 100 ug via INTRAVENOUS

## 2016-04-07 MED ORDER — CEFAZOLIN SODIUM-DEXTROSE 2-4 GM/100ML-% IV SOLN
2.0000 g | Freq: Once | INTRAVENOUS | Status: AC
  Administered 2016-04-07: 2 g via INTRAVENOUS

## 2016-04-07 MED ORDER — DIPHENHYDRAMINE HCL 12.5 MG/5ML PO ELIX: 12.5000 mg | ORAL_SOLUTION | Freq: Four times a day (QID) | ORAL | Status: DC | PRN

## 2016-04-07 MED ORDER — IOTHALAMATE MEGLUMINE 43 % IV SOLN
INTRAVENOUS | Status: DC | PRN
  Administered 2016-04-07: 15 mL

## 2016-04-07 MED ORDER — TAMSULOSIN HCL 0.4 MG PO CAPS
0.4000 mg | ORAL_CAPSULE | Freq: Every day | ORAL | Status: DC
  Administered 2016-04-07 – 2016-04-08 (×2): 0.4 mg via ORAL
  Filled 2016-04-07 (×2): qty 1

## 2016-04-07 MED ORDER — PROPOFOL 10 MG/ML IV BOLUS
INTRAVENOUS | Status: DC | PRN
  Administered 2016-04-07: 100 mg via INTRAVENOUS

## 2016-04-07 MED ORDER — GLYCOPYRROLATE 0.2 MG/ML IJ SOLN
INTRAMUSCULAR | Status: DC | PRN
  Administered 2016-04-07: 0.4 mg via INTRAVENOUS

## 2016-04-07 MED ORDER — CEFAZOLIN SODIUM 1-5 GM-% IV SOLN
1.0000 g | Freq: Three times a day (TID) | INTRAVENOUS | Status: AC
  Administered 2016-04-07 – 2016-04-08 (×2): 1 g via INTRAVENOUS
  Filled 2016-04-07 (×2): qty 50

## 2016-04-07 MED ORDER — ACETAMINOPHEN 10 MG/ML IV SOLN
INTRAVENOUS | Status: DC | PRN
Start: 2016-04-07 — End: 2016-04-07
  Administered 2016-04-07: 1000 mg via INTRAVENOUS

## 2016-04-07 MED ORDER — ROCURONIUM BROMIDE 100 MG/10ML IV SOLN
INTRAVENOUS | Status: DC | PRN
  Administered 2016-04-07: 10 mg via INTRAVENOUS
  Administered 2016-04-07: 20 mg via INTRAVENOUS
  Administered 2016-04-07: 50 mg via INTRAVENOUS
  Administered 2016-04-07: 10 mg via INTRAVENOUS

## 2016-04-07 MED ORDER — SODIUM CHLORIDE 0.9 % IV SOLN
INTRAVENOUS | Status: DC
  Administered 2016-04-07 – 2016-04-08 (×3): via INTRAVENOUS

## 2016-04-07 MED ORDER — PHENYLEPHRINE HCL 10 MG/ML IJ SOLN
INTRAMUSCULAR | Status: DC | PRN
  Administered 2016-04-07: 100 ug via INTRAVENOUS

## 2016-04-07 MED ORDER — ALBUMIN HUMAN 25 % IV SOLN
25.0000 g | INTRAVENOUS | Status: DC
  Filled 2016-04-07 (×2): qty 100

## 2016-04-07 MED ORDER — ROCURONIUM BROMIDE 100 MG/10ML IV SOLN
INTRAVENOUS | Status: DC | PRN
  Administered 2016-04-07: 20 mg via INTRAVENOUS
  Administered 2016-04-07: 50 mg via INTRAVENOUS

## 2016-04-07 MED ORDER — LIDOCAINE HCL (PF) 4 % IJ SOLN
INTRAMUSCULAR | Status: DC | PRN
  Administered 2016-04-07: 4 mL via RESPIRATORY_TRACT

## 2016-04-07 MED ORDER — EPHEDRINE SULFATE 50 MG/ML IJ SOLN
INTRAMUSCULAR | Status: AC
  Filled 2016-04-07: qty 1

## 2016-04-07 MED ORDER — NITROFURANTOIN MONOHYD MACRO 100 MG PO CAPS
100.0000 mg | ORAL_CAPSULE | Freq: Every day | ORAL | Status: DC
  Administered 2016-04-07 – 2016-04-08 (×2): 100 mg via ORAL
  Filled 2016-04-07 (×2): qty 1

## 2016-04-07 MED ORDER — DIPHENHYDRAMINE HCL 50 MG/ML IJ SOLN: 12.5000 mg | Freq: Four times a day (QID) | INTRAMUSCULAR | Status: DC | PRN

## 2016-04-07 MED ORDER — OXYBUTYNIN CHLORIDE 5 MG PO TABS: 5.0000 mg | ORAL_TABLET | Freq: Three times a day (TID) | ORAL | Status: DC | PRN

## 2016-04-07 MED ORDER — ALBUMIN HUMAN 5 % IV SOLN
25.0000 g | INTRAVENOUS | Status: DC
  Administered 2016-04-07: 12.5 g via INTRAVENOUS

## 2016-04-07 MED ORDER — EPHEDRINE SULFATE 50 MG/ML IJ SOLN
INTRAMUSCULAR | Status: DC | PRN
  Administered 2016-04-07: 10 mg via INTRAVENOUS
  Administered 2016-04-07 (×2): 5 mg via INTRAVENOUS
  Administered 2016-04-07: 10 mg via INTRAVENOUS

## 2016-04-07 MED ORDER — BELLADONNA ALKALOIDS-OPIUM 16.2-60 MG RE SUPP: 1.0000 | Freq: Four times a day (QID) | RECTAL | Status: DC | PRN

## 2016-04-07 MED ORDER — MOMETASONE FURO-FORMOTEROL FUM 200-5 MCG/ACT IN AERO
2.0000 | INHALATION_SPRAY | Freq: Two times a day (BID) | RESPIRATORY_TRACT | Status: DC
  Administered 2016-04-07 – 2016-04-08 (×2): 2 via RESPIRATORY_TRACT
  Filled 2016-04-07: qty 8.8

## 2016-04-07 MED ORDER — ONDANSETRON HCL 4 MG/2ML IJ SOLN: 4.0000 mg | INTRAMUSCULAR | Status: DC | PRN

## 2016-04-07 MED ORDER — FERROUS SULFATE 325 (65 FE) MG PO TABS
325.0000 mg | ORAL_TABLET | Freq: Every day | ORAL | Status: DC
  Administered 2016-04-07 – 2016-04-08 (×2): 325 mg via ORAL
  Filled 2016-04-07 (×2): qty 1

## 2016-04-07 MED ORDER — SODIUM CHLORIDE 0.9 % IJ SOLN
INTRAMUSCULAR | Status: AC
  Filled 2016-04-07: qty 10

## 2016-04-07 MED ORDER — AMLODIPINE BESYLATE 5 MG PO TABS
5.0000 mg | ORAL_TABLET | Freq: Every day | ORAL | Status: DC
  Administered 2016-04-07: 5 mg via ORAL
  Filled 2016-04-07: qty 1

## 2016-04-07 MED ORDER — CETYLPYRIDINIUM CHLORIDE 0.05 % MT LIQD
7.0000 mL | Freq: Two times a day (BID) | OROMUCOSAL | Status: DC
  Administered 2016-04-07 – 2016-04-08 (×2): 7 mL via OROMUCOSAL

## 2016-04-07 MED ORDER — FAMOTIDINE 20 MG PO TABS
20.0000 mg | ORAL_TABLET | Freq: Every day | ORAL | Status: DC
  Administered 2016-04-08: 20 mg via ORAL
  Filled 2016-04-07 (×2): qty 1

## 2016-04-07 MED ORDER — IPRATROPIUM-ALBUTEROL 0.5-2.5 (3) MG/3ML IN SOLN: 3.0000 mL | RESPIRATORY_TRACT | Status: DC | PRN

## 2016-04-07 MED ORDER — ALBUMIN HUMAN 5 % IV SOLN
INTRAVENOUS | Status: DC | PRN
  Administered 2016-04-07: 16:00:00 via INTRAVENOUS

## 2016-04-07 MED ORDER — LISINOPRIL 10 MG PO TABS
10.0000 mg | ORAL_TABLET | Freq: Every day | ORAL | Status: DC
  Administered 2016-04-07 – 2016-04-08 (×2): 10 mg via ORAL
  Filled 2016-04-07 (×2): qty 1

## 2016-04-07 MED ORDER — IPRATROPIUM-ALBUTEROL 0.5-2.5 (3) MG/3ML IN SOLN
3.0000 mL | RESPIRATORY_TRACT | Status: DC
  Administered 2016-04-07: 3 mL via RESPIRATORY_TRACT

## 2016-04-07 MED ORDER — FINASTERIDE 5 MG PO TABS
5.0000 mg | ORAL_TABLET | Freq: Every day | ORAL | Status: DC
  Administered 2016-04-07 – 2016-04-08 (×2): 5 mg via ORAL
  Filled 2016-04-07 (×2): qty 1

## 2016-04-07 MED ORDER — ACETAMINOPHEN 325 MG PO TABS: 650.0000 mg | ORAL_TABLET | ORAL | Status: DC | PRN

## 2016-04-07 MED ORDER — LACTATED RINGERS IV SOLN
INTRAVENOUS | Status: DC | PRN
  Administered 2016-04-07: 16:00:00 via INTRAVENOUS

## 2016-04-07 MED ORDER — ATROPINE SULFATE 1 MG/10ML IJ SOSY
PREFILLED_SYRINGE | INTRAMUSCULAR | Status: AC
  Filled 2016-04-07: qty 10

## 2016-04-07 MED ORDER — GABAPENTIN 300 MG PO CAPS
300.0000 mg | ORAL_CAPSULE | Freq: Every day | ORAL | Status: DC
Start: 2016-04-07 — End: 2016-04-08
  Administered 2016-04-07: 300 mg via ORAL
  Filled 2016-04-07: qty 1

## 2016-04-07 MED ORDER — SUGAMMADEX SODIUM 200 MG/2ML IV SOLN
INTRAVENOUS | Status: DC | PRN
  Administered 2016-04-07: 130 mg via INTRAVENOUS

## 2016-04-07 MED ORDER — ACETAMINOPHEN 10 MG/ML IV SOLN
INTRAVENOUS | Status: AC
  Filled 2016-04-07: qty 100

## 2016-04-07 MED ORDER — PHENYLEPHRINE HCL 10 MG/ML IJ SOLN
INTRAMUSCULAR | Status: DC | PRN
  Administered 2016-04-07 (×3): 100 ug via INTRAVENOUS

## 2016-04-07 MED ORDER — LIDOCAINE HCL (CARDIAC) 20 MG/ML IV SOLN
INTRAVENOUS | Status: DC | PRN
  Administered 2016-04-07: 40 mg via INTRAVENOUS

## 2016-04-07 MED ORDER — HYDROMORPHONE HCL 1 MG/ML IJ SOLN
INTRAMUSCULAR | Status: DC | PRN
  Administered 2016-04-07: 0.5 mg via INTRAVENOUS

## 2016-04-07 MED ORDER — MORPHINE SULFATE (PF) 2 MG/ML IV SOLN: 2.0000 mg | INTRAVENOUS | Status: DC | PRN

## 2016-04-07 MED ORDER — OXYCODONE-ACETAMINOPHEN 5-325 MG PO TABS
1.0000 | ORAL_TABLET | ORAL | Status: DC | PRN
  Administered 2016-04-07 – 2016-04-08 (×2): 1 via ORAL
  Filled 2016-04-07 (×2): qty 1

## 2016-04-07 MED ORDER — IPRATROPIUM-ALBUTEROL 0.5-2.5 (3) MG/3ML IN SOLN
RESPIRATORY_TRACT | Status: AC
  Filled 2016-04-07: qty 3

## 2016-04-07 SURGICAL SUPPLY — 48 items
ADAPTER IRRIG TUBE 2 SPIKE SOL (ADAPTER) ×8 IMPLANT
BAG DRAIN CYSTO-URO LG1000N (MISCELLANEOUS) ×4 IMPLANT
BAG URO DRAIN 2000ML W/SPOUT (MISCELLANEOUS) ×4 IMPLANT
BAG URO DRAIN 4000ML (MISCELLANEOUS) ×4 IMPLANT
CATH FOL 2WAY LX 20X30 (CATHETERS) IMPLANT
CATH FOL 3WAY LX COUV 24X75 (CATHETERS) ×4 IMPLANT
CATH FOL LEG HOLDER (MISCELLANEOUS) ×4 IMPLANT
CATH FOLEY 3WAY 30CC 22FR (CATHETERS) IMPLANT
CATH URETL 5X70 OPEN END (CATHETERS) ×8 IMPLANT
CNTNR SPEC 2.5X3XGRAD LEK (MISCELLANEOUS) ×2
CONRAY 43 FOR UROLOGY 50M (MISCELLANEOUS) ×4 IMPLANT
CONT SPEC 4OZ STER OR WHT (MISCELLANEOUS) ×2
CONTAINER COLLECT MORCELLATR (MISCELLANEOUS) ×2 IMPLANT
CONTAINER SPEC 2.5X3XGRAD LEK (MISCELLANEOUS) ×2 IMPLANT
DRAPE SHEET LG 3/4 BI-LAMINATE (DRAPES) ×4 IMPLANT
DRAPE UTILITY 15X26 TOWEL STRL (DRAPES) ×4 IMPLANT
DRESSING TELFA 4X3 1S ST N-ADH (GAUZE/BANDAGES/DRESSINGS) ×4 IMPLANT
ELECT LOOP 22F BIPOLAR SML (ELECTROSURGICAL) ×4
ELECTRODE LOOP 22F BIPOLAR SML (ELECTROSURGICAL) ×2 IMPLANT
FILTER OVERFLOW MORCELLATOR (FILTER) ×2 IMPLANT
GLIDEWIRE STIFF .35X180X3 HYDR (WIRE) ×4 IMPLANT
GLOVE BIO SURGEON STRL SZ 6.5 (GLOVE) ×3 IMPLANT
GLOVE BIO SURGEONS STRL SZ 6.5 (GLOVE) ×1
GOWN STRL REUS W/ TWL LRG LVL3 (GOWN DISPOSABLE) ×4 IMPLANT
GOWN STRL REUS W/TWL LRG LVL3 (GOWN DISPOSABLE) ×4
KIT RM TURNOVER CYSTO AR (KITS) ×4 IMPLANT
LASER FIBER 550M SMARTSCOPE (Laser) ×4 IMPLANT
LOOP MONOPOLAR YLW (ELECTROSURGICAL) IMPLANT
MORCELLATOR COLLECT CONTAINER (MISCELLANEOUS) ×4
MORCELLATOR OVERFLOW FILTER (FILTER) ×4
MORCELLATOR ROTATION 4.75 335 (MISCELLANEOUS) ×4 IMPLANT
NDL SAFETY ECLIPSE 18X1.5 (NEEDLE) ×2 IMPLANT
NEEDLE HYPO 18GX1.5 SHARP (NEEDLE) ×2
PACK CYSTO AR (MISCELLANEOUS) ×4 IMPLANT
PREP PVP WINGED SPONGE (MISCELLANEOUS) ×4 IMPLANT
PUMP SINGLE ACTION SAP (PUMP) IMPLANT
SENSORWIRE 0.038 NOT ANGLED (WIRE) ×8
SET CYSTO W/LG BORE CLAMP LF (SET/KITS/TRAYS/PACK) ×4 IMPLANT
SET IRRIG Y TYPE TUR BLADDER L (SET/KITS/TRAYS/PACK) ×4 IMPLANT
SOL .9 NS 3000ML IRR  AL (IV SOLUTION) ×36
SOL .9 NS 3000ML IRR UROMATIC (IV SOLUTION) ×36 IMPLANT
SURGILUBE 2OZ TUBE FLIPTOP (MISCELLANEOUS) ×4 IMPLANT
SYRINGE IRR TOOMEY STRL 70CC (SYRINGE) ×4 IMPLANT
TUBE PUMP MORCELLATOR PIRANHA (TUBING) ×4 IMPLANT
TUBING CONNECTING 10 (TUBING) ×3 IMPLANT
TUBING CONNECTING 10' (TUBING) ×1
WATER STERILE IRR 1000ML POUR (IV SOLUTION) ×4 IMPLANT
WIRE SENSOR 0.038 NOT ANGLED (WIRE) ×4 IMPLANT

## 2016-04-07 SURGICAL SUPPLY — 24 items
BAG DRAIN CYSTO-URO LG1000N (MISCELLANEOUS) ×3 IMPLANT
BAG URO DRAIN 2000ML W/SPOUT (MISCELLANEOUS) ×3 IMPLANT
CATH FOLEY 2WAY  5CC 16FR (CATHETERS)
CATH URTH 16FR FL 2W BLN LF (CATHETERS) IMPLANT
CORD URO TURP 10FT (MISCELLANEOUS) ×3 IMPLANT
DRAPE UTILITY 15X26 TOWEL STRL (DRAPES) ×3 IMPLANT
ELECT BIVAP BIPO 22/24 DONUT (ELECTROSURGICAL) ×3
ELECT LOOP 22F BIPOLAR SML (ELECTROSURGICAL) ×3
ELECT REM PT RETURN 9FT ADLT (ELECTROSURGICAL)
ELECTRD BIVAP BIPO 22/24 DONUT (ELECTROSURGICAL) ×1 IMPLANT
ELECTRODE LOOP 22F BIPOLAR SML (ELECTROSURGICAL) ×1 IMPLANT
ELECTRODE REM PT RTRN 9FT ADLT (ELECTROSURGICAL) IMPLANT
GLOVE BIO SURGEON STRL SZ 6.5 (GLOVE) ×2 IMPLANT
GLOVE BIO SURGEONS STRL SZ 6.5 (GLOVE) ×1
GOWN STRL REUS W/ TWL LRG LVL3 (GOWN DISPOSABLE) ×2 IMPLANT
GOWN STRL REUS W/TWL LRG LVL3 (GOWN DISPOSABLE) ×4
KIT RM TURNOVER CYSTO AR (KITS) ×3 IMPLANT
LOOP CUT BIPOLAR 24F LRG (ELECTROSURGICAL) ×3 IMPLANT
PACK CYSTO AR (MISCELLANEOUS) ×3 IMPLANT
PLUG CATH AND CAP STER (CATHETERS) ×3 IMPLANT
PREP PVP WINGED SPONGE (MISCELLANEOUS) ×3 IMPLANT
SET IRRIG Y TYPE TUR BLADDER L (SET/KITS/TRAYS/PACK) ×3 IMPLANT
SURGILUBE 2OZ TUBE FLIPTOP (MISCELLANEOUS) ×3 IMPLANT
WATER STERILE IRR 1000ML POUR (IV SOLUTION) ×3 IMPLANT

## 2016-04-07 NOTE — Transfer of Care (Signed)
Immediate Anesthesia Transfer of Care Note  Patient: Eric Mullins  Procedure(s) Performed: Procedure(s): CYSTOSCOPY WITH RETROGRADE PYELOGRAM (Right) HOLEP-LASER ENUCLEATION OF THE PROSTATE WITH MORCELLATION/ TURBT (N/A)  Patient Location: PACU  Anesthesia Type:General  Level of Consciousness: awake, alert  and oriented  Airway & Oxygen Therapy: Patient Spontanous Breathing and Patient connected to nasal cannula oxygen  Post-op Assessment: Report given to RN and Post -op Vital signs reviewed and stable  Post vital signs: Reviewed and stable  Last Vitals:  Filed Vitals:   04/07/16 0803 04/07/16 1342  BP: 163/61 137/57  Pulse: 72 68  Temp: 36.5 C   Resp: 18 16    Last Pain: There were no vitals filed for this visit.    Patients Stated Pain Goal: 0 ([id number])  Complications: No apparent anesthesia complications

## 2016-04-07 NOTE — Anesthesia Procedure Notes (Signed)
Procedure Name: Intubation Date/Time: 04/07/2016 4:06 PM Performed by: Silvana Newness Pre-anesthesia Checklist: Patient identified, Emergency Drugs available, Suction available, Patient being monitored and Timeout performed Patient Re-evaluated:Patient Re-evaluated prior to inductionOxygen Delivery Method: Circle system utilized Preoxygenation: Pre-oxygenation with 100% oxygen Intubation Type: IV induction Ventilation: Mask ventilation without difficulty Laryngoscope Size: Mac and 4 Grade View: Grade II Tube type: Oral Tube size: 7.5 mm Number of attempts: 1 Airway Equipment and Method: Rigid stylet Placement Confirmation: ETT inserted through vocal cords under direct vision,  positive ETCO2 and breath sounds checked- equal and bilateral Secured at: 22 cm Tube secured with: Tape Dental Injury: Teeth and Oropharynx as per pre-operative assessment

## 2016-04-07 NOTE — Transfer of Care (Signed)
Immediate Anesthesia Transfer of Care Note  Patient: Eric Mullins  Procedure(s) Performed: Procedure(s): Fulgeration and evacuation of clot (N/A)  Patient Location: PACU  Anesthesia Type:General  Level of Consciousness: awake and patient cooperative  Airway & Oxygen Therapy: Patient Spontanous Breathing and Patient connected to face mask oxygen  Post-op Assessment: Report given to RN, Post -op Vital signs reviewed and stable and Patient moving all extremities X 4  Post vital signs: Reviewed and stable  Last Vitals:  Filed Vitals:   04/07/16 1555 04/07/16 1733  BP:  136/61  Pulse: 66 78  Temp:  36.4 C  Resp:  14    Last Pain: There were no vitals filed for this visit.    Patients Stated Pain Goal: 0 ([id number])  Complications: No apparent anesthesia complications

## 2016-04-07 NOTE — Progress Notes (Signed)
Date of procedure: 04/07/2016  Preoperative diagnosis:  1. BPH with bladder outlet obstruction 2. Urinary retention 3. 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 Same as above 2. Bladder tumor  (2 cm right posterior wall)   Procedure: 1. Cystoscopy 2. Right retrograde pyelogram 3. TURBT (medium) 4. Holmium laser enucleation of the prostate with morcellation   Surgeon: [name], MD  Anesthesia: General  Complications: None  Intraoperative: Low-grade appearing papillary cavity involving 2 cm on the right posterior wall.  Massive BPH with very large intravesical median lobe.  Trabeculated bladder. Normal right retrograde pyelogram. Left UO surgically absent. Vascularity prosthetic fossa with fairly significant bleeding.  EBL: 500+ cc  SpecimBladder tumor, prostate chips  Drains: 24 French hematuria catheter (3 way)  Indication: Eric Mullins is a 81 y.o. patient with  massive BPH who recently developed urinary retention. He has a very large median lobe with significant intravesical protrusion. She also has a history of left low-grade upper tract urothelial carcinoma status post left nephroureterectomy. After reviewing the management options for treatment, he elected to proceed with the above surgical procedure(s). We have discussed the potential benefits and risks of the procedure, side effects of the proposed treatment, the likelihood of the patient achieving the goals of the procedure, and any potential problems that might occur during the procedure or recuperation. Informed consent has been obtained.  Description of procedure:  The patient was taken to the operating room and general anesthesia was induced.  The patient was placed in the dorsal lithotomy position, prepped and draped in the usual sterile fashion, and preoperative antibiotics were administered. A preoperative time-out was performed.    The rigid 21 French cystoscope was advanced per urethra into the bladder. The prostatic fossa was elongated, approximately 7 cm with severe trilobar coaptation. The bladder neck was significantly elevated with a very large median lobe protruding into the bladder. Inspection of the bladder mucosa revealed moderate trabeculation with some saccules. On the right posterior wall, there were some low-grade papillary tumor which was not appreciated on his previous flexible cystoscopy. This measured approximately 2 cm in diameter, broad-based.  There were no other appreciable tumors. At this point in time, cold cup biopsy forceps were used to resect this tumor and piecewise fashion down to the level of the muscularis. A 26 French resectoscope was then brought in and using the loop, the base of the tumor was fulgurated using bipolar energy. X  Next, the 550  laser fiber was brought in and using settings of 2 J and 50 Hz, 2 incisions were created on either side at the very large median lobe at the level of the bladder neck which were carried down distally just proximal to the verumontanum with a were met in the midline. Each of these incisions was then deepened down to the level of the capsule. Of note, that mucosa and prostate tissue was extremely friable. Visualization was somewhat poor. The median lobe was then enucleated in a caudal to cranial direction pulling the lobe towards the bladder neck. Eventually, the mucosa at the bladder neck was examined to be cleaved in the median lobe was freed. The median lobe was massive. This opened up the prostatic fossa quite nicely. The lateral lobes are still exceedingly large, however, given the poor visualization and size of the median lobe and ball-valve appearance, integration at the median lobe was deemed adequate and the lateral lobes were not addressed on this  occasion.  Next, a nephroscope was brought in and the parotid morcellator was used to carefully morcellated  the enucleated median left. Care was taken to ensure that the bladder was adequately distended and to avoid injury to the bladder itself. Again, due to fairly significant bleeding, visualization was somewhat poor. A second irrigation was brought in to help facilitate morcellation. Ultimately, was successfully morcellating this lobe. Finally, the bipolar loop was brought in and attempts to achieve hemostasis at the bladder neck. There was no significant well-formed clot on the surface of the prostatic fossa which was somewhat adherent. At this point, the nephroscope/resectoscope were removed and a 24 French hematuria 3-way Foley catheter was placed over a catheter guide. The balloon was filled with 75 cc of water and placed on traction. The patient was started on CBI given the appearance of the urine.  He is cleaned and dried, reversed from anesthesia, and taken to the PACU in stable condition.  [name], M.D.

## 2016-04-07 NOTE — Anesthesia Preprocedure Evaluation (Addendum)
Anesthesia Evaluation  Patient identified by MRN, date of birth, ID band Patient awake    Reviewed: Allergy & Precautions, H&P , NPO status , Patient's Chart, lab work & pertinent test results, reviewed documented beta blocker date and time   Airway Mallampati: II  TM Distance: >3 FB Neck ROM: full    Dental  (+) Teeth Intact   Pulmonary neg pulmonary ROS, COPD, Current Smoker,    Pulmonary exam normal        Cardiovascular Exercise Tolerance: Good hypertension, negative cardio ROS Normal cardiovascular exam+ Valvular Problems/Murmurs  Rhythm:regular Rate:Normal     Neuro/Psych  Neuromuscular disease CVA negative neurological ROS  negative psych ROS   GI/Hepatic negative GI ROS, Neg liver ROS, GERD  ,  Endo/Other  negative endocrine ROS  Renal/GU Renal diseasenegative Renal ROS  negative genitourinary   Musculoskeletal   Abdominal   Peds  Hematology negative hematology ROS (+)   Anesthesia Other Findings Past Medical History:   Hypertension                                                 Hyperlipidemia                                               COPD (chronic obstructive pulmonary disease) (*              GERD (gastroesophageal reflux disease)                       Urinary tract infection                                      Paraphimosis                                                 Gross hematuria                                              Kidney filling defect                                        BPH (benign prostatic hyperplasia)                           Encounter for removal of urinary catheter                    Primary cancer of left renal pelvis (HCC)                    Elevated PSA  Incomplete bladder emptying                                  Heart murmur                                                 Stroke ([place])                                     2010       Past Surgical History:   EAR MASTOIDECTOMY W/ COCHLEAR IMPLANT W/ LANDM*               TOTAL KNEE ARTHROPLASTY                         Left              ELBOW SURGERY                                   Left                Comment:ball and joint replaced   TOTAL NEPHRECTOMY                               Left 11/2013      FOOT SURGERY                                    Left 1980           Comment:lawn mower accident   BACK SURGERY                                                    Comment:lower back. patient unsure if there is metal in              back   CATARACT EXTRACTION W/ INTRAOCULAR LENS  IMPLA* Bilateral              COCHLEAR IMPLANT                                Left                Comment:middle ear implant BMI    Body Mass Index   22.05 kg/m 2     Reproductive/Obstetrics negative OB ROS                            Anesthesia Physical Anesthesia Plan  ASA: IV  Anesthesia Plan: General LMA   Post-op Pain Management:    Induction:   Airway Management Planned:   Additional Equipment:   Intra-op Plan:   Post-operative Plan:   Informed Consent: I have reviewed the patients History and Physical, chart, labs and  discussed the procedure including the risks, benefits and alternatives for the proposed anesthesia with the patient or authorized representative who has indicated his/her understanding and acceptance.   Dental Advisory Given  Plan Discussed with: CRNA  Anesthesia Plan Comments:        Anesthesia Quick Evaluation

## 2016-04-07 NOTE — Interval H&P Note (Signed)
History and Physical Interval Note:  04/07/2016 8:23 AM  Eric Mullins  has presented today for surgery, with the diagnosis of bph  The various methods of treatment have been discussed with the patient and family. After consideration of risks, benefits and other options for treatment, the patient has consented to  Procedure(s): CYSTOSCOPY WITH RETROGRADE PYELOGRAM (Right) HOLEP-LASER ENUCLEATION OF THE PROSTATE WITH MORCELLATION (N/A) as a surgical intervention .  The patient's history has been reviewed, patient examined, no change in status, stable for surgery.  I have reviewed the patient's chart and labs.  Questions were answered to the patient's satisfaction.    RRR CTAB  [name]

## 2016-04-07 NOTE — Anesthesia Procedure Notes (Signed)
Procedure Name: Intubation Date/Time: 04/07/2016 9:26 AM Performed by: Rosaria Ferries, Indiya Izquierdo Pre-anesthesia Checklist: Patient identified, Emergency Drugs available, Suction available and Patient being monitored Patient Re-evaluated:Patient Re-evaluated prior to inductionOxygen Delivery Method: Circle system utilized Preoxygenation: Pre-oxygenation with 100% oxygen Intubation Type: IV induction Laryngoscope Size: Mac and 3 Grade View: Grade I Tube type: Oral Tube size: 7.0 mm Number of attempts: 1 Placement Confirmation: ETT inserted through vocal cords under direct vision,  positive ETCO2 and breath sounds checked- equal and bilateral Secured at: 21 cm Tube secured with: Tape Dental Injury: Teeth and Oropharynx as per pre-operative assessment

## 2016-04-07 NOTE — Anesthesia Preprocedure Evaluation (Addendum)
Anesthesia Evaluation  Patient identified by MRN, date of birth, ID band Patient awake    Reviewed: Allergy & Precautions, H&P , NPO status , Patient's Chart, lab work & pertinent test results, reviewed documented beta blocker date and time   Airway Mallampati: II  TM Distance: >3 FB Neck ROM: full    Dental  (+) Teeth Intact   Pulmonary neg pulmonary ROS, COPD, Current Smoker,    Pulmonary exam normal        Cardiovascular Exercise Tolerance: Good hypertension, negative cardio ROS Normal cardiovascular exam+ Valvular Problems/Murmurs  Rhythm:regular Rate:Normal     Neuro/Psych  Neuromuscular disease CVA negative neurological ROS  negative psych ROS   GI/Hepatic negative GI ROS, Neg liver ROS, GERD  ,  Endo/Other  negative endocrine ROS  Renal/GU Renal diseasenegative Renal ROS  negative genitourinary   Musculoskeletal   Abdominal   Peds  Hematology negative hematology ROS (+)   Anesthesia Other Findings Past Medical History:   Hypertension                                                 Hyperlipidemia                                               COPD (chronic obstructive pulmonary disease) (*              GERD (gastroesophageal reflux disease)                       Urinary tract infection                                      Paraphimosis                                                 Gross hematuria                                              Kidney filling defect                                        BPH (benign prostatic hyperplasia)                           Encounter for removal of urinary catheter                    Primary cancer of left renal pelvis (HCC)                    Elevated PSA  Incomplete bladder emptying                                  Heart murmur                                                 Stroke ([place])                                     2010       Past Surgical History:   EAR MASTOIDECTOMY W/ COCHLEAR IMPLANT W/ LANDM*               TOTAL KNEE ARTHROPLASTY                         Left              ELBOW SURGERY                                   Left                Comment:ball and joint replaced   TOTAL NEPHRECTOMY                               Left 11/2013      FOOT SURGERY                                    Left 1980           Comment:lawn mower accident   BACK SURGERY                                                    Comment:lower back. patient unsure if there is metal in              back   CATARACT EXTRACTION W/ INTRAOCULAR LENS  IMPLA* Bilateral              COCHLEAR IMPLANT                                Left                Comment:middle ear implant BMI    Body Mass Index   22.05 kg/m 2    Patient is s/p TURBT from earlier today with continuous bleeding and now with instability of his blood pressure and heart rate.  Taking emergently back to the OR for inspection and fulguration.  Type and crossmatch ordered.  Plan for modified RSI due to patient's vitals being unstable.  Reproductive/Obstetrics negative OB ROS                            Anesthesia Physical  Anesthesia Plan  ASA: IV and emergent  Anesthesia Plan: General ETT and Modified Rapid Sequence   Post-op Pain Management:    Induction:   Airway Management Planned:   Additional Equipment:   Intra-op Plan:   Post-operative Plan:   Informed Consent: I have reviewed the patients History and Physical, chart, labs and discussed the procedure including the risks, benefits and alternatives for the proposed anesthesia with the patient or authorized representative who has indicated his/her understanding and acceptance.   Dental Advisory Given  Plan Discussed with: CRNA, Surgeon and Anesthesiologist  Anesthesia Plan Comments:        Anesthesia Quick Evaluation

## 2016-04-07 NOTE — H&P (View-Only) (Signed)
3:37 PM  03/11/2016   Delorise Royals [birth year] [id number]  Referring provider: Arlis Porta., MD [id number] [place], [place] [address]  Chief Complaint  Patient presents with  . 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  He is currently in retention and being treated for Aerococcus UTI on ampicillin.    History of upper tract TCC  He was  found to have left upper pole filling defect suspicious for upper tract TCC on CT Urogram 06/2014. Further workup of this lesion revealed a papillary upper pole lesion consistent with upper tract TCC. Given the inability to easily survey this lesion endoscopically, he underwent left hand-assisted laparoscopic nephroureterectomy on 11/20/2014.   Surgical pathology showed a low-grade superficial papillary ( inverted architecture) without evidence of invasion. Single lymph node was negative for malignancy. pTaN0Mx  NED.  Returns today for cysto. Last cysto prior to today 11/2015 negative.   Surveillance RUS wnl on 03/10/16 with stable complex cyst 6.4 cm.    Elevated PSA He underwent prostate biopsy on 07/18/14 which was negative for malignancy in setting of elevated PSA to 17.7 (07/03/14), repeat 7.7 ng/dL in 02/10/15. TRUS vol 149 cc with large median lobe noted.   Cystoscopic exam shows a large median lobe with moderate bladder trabeculation.  Urinary retention Developed urinary retention 03/01/16, failed VT x 2.  Currently on flomax/ finasteride.  Unable to self cath due to prostate anatomy.     PMH: Past Medical History  Diagnosis Date  . Hypertension   . Hyperlipidemia   . COPD (chronic obstructive pulmonary disease) ([place])   . GERD (gastroesophageal reflux disease)   . Urinary tract infection   . Paraphimosis   . Gross hematuria   . Kidney filling defect   . BPH (benign prostatic hyperplasia)   . Encounter for removal of urinary catheter   .  Primary cancer of left renal pelvis ([place])   . Elevated PSA   . Incomplete bladder emptying     Surgical History: Past Surgical History  Procedure Laterality Date  . Ear mastoidectomy w/ cochlear implant w/ landmark    . Back surgery    . Total knee arthroplasty Left   . Elbow surgery Left     Home Medications:    Medication List       This list is accurate as of: 03/11/16  3:37 PM.  Always use your most recent med list.               amLODipine 5 MG tablet  Commonly known as:  NORVASC  Take 5 mg by mouth daily.     amoxicillin 500 MG capsule  Commonly known as:  AMOXIL  Take 1 capsule (500 mg total) by mouth 2 (two) times daily.     aspirin 81 MG tablet  Take 81 mg by mouth daily.     EYE HEALTH Caps  Take by mouth.     ferrous sulfate 324 (65 Fe) MG Tbec  Take 1 tablet by mouth daily.     finasteride 5 MG tablet  Commonly known as:  PROSCAR  Take 1 tablet (5 mg total) by mouth daily.     Fluticasone-Salmeterol 250-50 MCG/DOSE Aepb  Commonly known as:  ADVAIR  Inhale 1 puff into the lungs 2 (two) times daily.     gabapentin 300 MG capsule  Commonly known as:  NEURONTIN  Take 300 mg by mouth at bedtime. Reported on 03/06/2016     ipratropium-albuterol 0.5-2.5 (3) MG/3ML Soln  Commonly known as:  DUONEB  Take 3 mLs by nebulization every 4 (four) hours as needed.     lisinopril 10 MG tablet  Commonly known as:  PRINIVIL,ZESTRIL  Take 10 mg by mouth daily.     metoprolol tartrate 25 MG tablet  Commonly known as:  LOPRESSOR  Take 25 mg by mouth 2 (two) times daily.     pravastatin 40 MG tablet  Commonly known as:  PRAVACHOL  Take 40 mg by mouth daily.     PROAIR HFA 108 (90 Base) MCG/ACT inhaler  Generic drug:  albuterol     ranitidine 75 MG tablet  Commonly known as:  ZANTAC  Take 75 mg by mouth 2 (two) times daily. Reported on 03/06/2016     rOPINIRole 1 MG tablet  Commonly known as:  REQUIP     tamsulosin 0.4 MG Caps capsule  Commonly known  as:  FLOMAX  Take 1 capsule (0.4 mg total) by mouth daily after supper.     triamcinolone cream 0.1 %  Commonly known as:  KENALOG  Apply 1 application topically 2 (two) times daily.        Allergies: No Known Allergies  Family History: Family History  Problem Relation Age of Onset  . Brain cancer Father   . Lung cancer Father   . Kidney disease Neg Hx   . Prostate cancer Neg Hx     Social History:  reports that he has been smoking Pipe.  He has never used smokeless tobacco. He reports that he does not drink alcohol or use illicit drugs.   Physical Exam: BP 180/62 mmHg  Pulse 66  Ht 5\' 8"  (1.727 m)  Wt 147 lb (66.679 kg)  BMI 22.36 kg/m2  Constitutional:  Alert and oriented, No acute distress. HEENT: [place] AT, moist mucus membranes.  Trachea midline, no masses. Cardiovascular: No clubbing, cyanosis, or edema. Respiratory: Normal respiratory effort, no increased work of breathing. GI: Abdomen is soft, nontender, nondistended, no abdominal masses GU: No CVA tenderness. Normal phallus with orthotopic meatus.   Skin: No rashes, bruises or suspicious lesions. Neurologic: Grossly intact, no focal deficits, moving all 4 extremities. Psychiatric: Normal mood and affect.  Laboratory Data: Lab Results  Component Value Date   WBC 9.9 12/07/2014   HGB 10.1* 12/07/2014   HCT 31.8* 12/07/2014   MCV 86 12/07/2014   PLT 336 12/07/2014    Lab Results  Component Value Date   CREATININE 1.54* 12/07/2014     Cystoscopy Procedure Note  Patient identification was confirmed, informed consent was obtained, and patient was prepped using Betadine solution.  Lidocaine jelly was administered per urethral meatus.    Preoperative abx where received prior to procedure.     Pre-Procedure: - Inspection reveals a normal caliber ureteral meatus.  Procedure: The flexible cystoscope was introduced without difficulty - No urethral strictures/lesions are present. - Enlarged prostate with  trilobar coaptation, friable mucosa, mild bleeding with manipulation. - Elevated bladder neck - Bilateral ureteral orifices identified - Bladder mucosa  reveals no ulcers, tumors, or lesions - No bladder stones - Moderate to severe trabeculation  Retroflexion shows massive median lobe pushing up and the base of the bladder  Post-Procedure: - Patient tolerated the procedure well  Assessment & Plan: 81 year old male status post cystoscopy today for surveillance following left nephroureterostomy. Cystoscopy negative other than for massive prostate and small prostatic  calcification.  1. Urothelial carcinoma of kidney, left S/p L laparoscopic nephroureterectomy 11/2014. Surgical pathology showed a low-grade superficial papillary ( inverted architecture) without evidence of invasion. Single lymph node was negative for malignancy. pTaN0Mx.  NED.   -plan for repeat cysto on 3-4 months - Urinalysis, Complete - lidocaine (XYLOCAINE) 2 % jelly 1 application; Place 1 application into the urethra once. - ciprofloxacin (CIPRO) tablet 500 mg; Take 1 tablet (500 mg total) by mouth once.  2. Benign prostatic hypertrophy (BPH) with incomplete bladder emptying/ urinary retention Massively enlarged prostate, Baseline incomplete bladder emptying most recently with urinary retention. He failed several voiding trials including today. 150 cc prostate with massive median lobe.    Alternatives discussed today at length. He is unable to self catheter due to prostatic anatomy. We discussed results options including open simple prostatectomy, robotic simple prostatectomy, holmium laser enucleation of the prostate, end-stage TURP given size of gland. He is most interested in holmium laser enucleation of the prostate which is a minimally invasive technique. I have advised him, there is a risk of failure of the procedure to resolve his retention and incomplete bladder emptying, worsening of his irritative voiding  symptoms including frequency and urgency, and risk of urinary incontinence both urge and stress which typically resolve that may persist. He also understands was of bleeding, infection, damage to surrounding structures including the bladder, retrograde ejaculation, amongst others. He will need a Foley catheter for several days postop.  He also discussed proceeding with right retrograde pyelogram to assess his right upper tract given his history of upper tract urothelial carcinoma. He is agreeable with this.  He will need to stop ASA 5 days prior to the procedure.    Repeat preop urine culture to ensure resolution of his infection.   3. Elevated PSA S/p prostate biopsy on 07/18/14 which was negative for malignancy in setting of elevated PSA to 17.7 (07/03/14), repeat 7.7 ng/dL in 02/10/15. TRUS vol 149 cc with large median lobe noted.  Recent PSA consistent. with size of gland, will continue to follow.   -recheck PSA annually, defer recheck today given current Foley/ retention   Schedule HoLEP, cysto, right RTG   [name], MD  [facility] [address], [place], [place] [address] [phone]

## 2016-04-07 NOTE — Op Note (Signed)
Date of procedure: 04/07/2016  Preoperative diagnosis:  1. Hematuria 2. Postoperative bleeding   Postoperative diagnosis:  1. Same as above   Procedure: 1. Cystoscopy 2. Fulguration of bladder neck/prostatic fossa 3. Clot evacuation  Surgeon: [name], MD  Anesthesia: General  Complications: None  Intraoperative findings: Well formed clot within the bladder evacuated. Bleeding at the bladder neck noted.  EBL: 100 cc  Specimens: None  Drains: 24 French three-way Foley catheter (hematuria catheter)  Indication: Eric Mullins is a 81 y.o. patient massive BPH/large median lobe, urinary retention status post holmium laser enucleation of the prostate, TURBT, right retrograde pyelogram to continue to have significant bleeding and PACU despite brisk CBI and catheter traction.  He also had an episode of hypotension and bradycardia which was treated by anesthesia. Given the concern for ongoing bleeding, he returned emergently to the operating room for cystoscopy/fulguration.  Description of procedure:  The patient was taken to the operating room and general anesthesia was induced.  The patient was placed in the dorsal lithotomy position, prepped and draped in the usual sterile fashion, and preoperative antibiotics were administered. A preoperative time-out was performed.   At this point in time, a 30 French resectoscope was advanced per urethra into the bladder. A well-formed clot was visualized within the bladder and a [place] syringe was used to evacuate this large clot. The right posterior bladder wall TUR site was identified and fulgurated additionally using the loop of the bipolar resectoscope. The bladder neck was then carefully inspected and no evidence significant oozing as well as pulsatile bleeding  from the mucosa along this edge as well as from the bladder neck fibers at the site of this resection and prostatic fossa. Careful hemostasis was achieved using both the loop of  the bipolar resectoscope as well as the button device. Eventually, adequate hemostasis was achieved and I was able to turn off the irrigation with no evidence of active residual bleeding. The scope was then advanced back into the bladder. There is no evidence of bladder injury noted. I did carefully inspect the trigone but a portion was then able to identify the UO at this time.    Finally, the scope was removed and a 24 [place] three-way Foley catheter was replaced over a catheter guide. The balloon was filled with 60 cc of sterile water. Slow drip CBI was resumed and the urine remained clear. He was then repositioned in the supine position, reversed from anesthesia, and taken to the PACU in stable condition.  Plan: Plan to recheck H&H in the PACU. May consider blood transfusion pending the result. If urine remains clear, he has remained hemodynamically stable and may go to the floor.  [name], M.D.

## 2016-04-08 ENCOUNTER — Encounter: Payer: Self-pay | Admitting: Urology

## 2016-04-08 DIAGNOSIS — N400 Enlarged prostate without lower urinary tract symptoms: Secondary | ICD-10-CM

## 2016-04-08 DIAGNOSIS — C674 Malignant neoplasm of posterior wall of bladder: Secondary | ICD-10-CM | POA: Diagnosis not present

## 2016-04-08 LAB — BASIC METABOLIC PANEL
ANION GAP: 7 (ref 5–15)
BUN: 21 mg/dL — ABNORMAL HIGH (ref 6–20)
CALCIUM: 8.3 mg/dL — AB (ref 8.9–10.3)
CO2: 24 mmol/L (ref 22–32)
CREATININE: 1.19 mg/dL (ref 0.61–1.24)
Chloride: 108 mmol/L (ref 101–111)
GFR calc Af Amer: >60 mL/min (ref >60)
GFR calc non Af Amer: 57 mL/min — ABNORMAL LOW (ref >60)
GLUCOSE: 123 mg/dL — AB (ref 65–99)
Potassium: 4.4 mmol/L (ref 3.5–5.1)
Sodium: 139 mmol/L (ref 135–145)

## 2016-04-08 LAB — CBC
HEMATOCRIT: 28.5 % — AB (ref 40.0–52.0)
Hemoglobin: 9.6 g/dL — ABNORMAL LOW (ref 13.0–18.0)
MCH: 30.1 pg (ref 26.0–34.0)
MCHC: 33.5 g/dL (ref 32.0–36.0)
MCV: 89.8 fL (ref 80.0–100.0)
Platelets: 113 10*3/uL — ABNORMAL LOW (ref 150–440)
RBC: 3.18 MIL/uL — ABNORMAL LOW (ref 4.40–5.90)
RDW: 14 % (ref 11.5–14.5)
WBC: 8.9 10*3/uL (ref 3.8–10.6)

## 2016-04-08 MED ORDER — FINASTERIDE 5 MG PO TABS: 5.0000 mg | ORAL_TABLET | Freq: Every day | ORAL | Status: DC

## 2016-04-08 MED ORDER — HYDROCODONE-ACETAMINOPHEN 5-325 MG PO TABS: 1.0000 | ORAL_TABLET | Freq: Four times a day (QID) | ORAL | Status: DC | PRN

## 2016-04-08 MED ORDER — TAMSULOSIN HCL 0.4 MG PO CAPS: 0.4000 mg | ORAL_CAPSULE | Freq: Every day | ORAL | Status: DC

## 2016-04-08 MED ORDER — NITROFURANTOIN MONOHYD MACRO 100 MG PO CAPS: 100.0000 mg | ORAL_CAPSULE | Freq: Every day | ORAL | Status: DC

## 2016-04-08 NOTE — Anesthesia Postprocedure Evaluation (Signed)
Anesthesia Post Note  Patient: GRACEN SLOTT  Procedure(s) Performed: Procedure(s) (LRB): CYSTOSCOPY WITH RETROGRADE PYELOGRAM (Right) HOLEP-LASER ENUCLEATION OF THE PROSTATE WITH MORCELLATION/ TURBT (N/A)  Patient location during evaluation: PACU Anesthesia Type: General Level of consciousness: awake and alert Pain management: pain level controlled Vital Signs Assessment: post-procedure vital signs reviewed and stable Respiratory status: spontaneous breathing, nonlabored ventilation, respiratory function stable and patient connected to nasal cannula oxygen Cardiovascular status: blood pressure returned to baseline and stable Postop Assessment: no signs of nausea or vomiting Anesthetic complications: no    Last Vitals:  Filed Vitals:   04/08/16 0941 04/08/16 1307  BP: 123/78 120/63  Pulse:  84  Temp:  36.6 C  Resp:  18    Last Pain:  Filed Vitals:   04/08/16 1517  PainSc: 0-No pain                 Molli Barrows

## 2016-04-08 NOTE — Op Note (Signed)
Date of procedure: 04/08/2016  Preoperative diagnosis:  1. BPH with bladder outlet obstruction 2. Urinary retention 3. 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 Same as above 2. Bladder tumor  (2 cm right posterior wall)   Procedure: 1. Cystoscopy 2. Right retrograde pyelogram 3. TURBT (medium) 4. Holmium laser enucleation of the prostate with morcellation   Surgeon: [name], MD  Anesthesia: General  Complications: None  Intraoperative: Low-grade appearing papillary cavity involving 2 cm on the right posterior wall.  Massive BPH with very large intravesical median lobe.  Trabeculated bladder. Normal right retrograde pyelogram. Left UO surgically absent. Vascularity prosthetic fossa with fairly significant bleeding.  EBL: 500+ cc  SpecimBladder tumor, prostate chips  Drains: 24 French hematuria catheter (3 way)  Indication: Eric Mullins is a 81 y.o. patient with  massive BPH who recently developed urinary retention. He has a very large median lobe with significant intravesical protrusion. She also has a history of left low-grade upper tract urothelial carcinoma status post left nephroureterectomy. After reviewing the management options for treatment, he elected to proceed with the above surgical procedure(s). We have discussed the potential benefits and risks of the procedure, side effects of the proposed treatment, the likelihood of the patient achieving the goals of the procedure, and any potential problems that might occur during the procedure or recuperation. Informed consent has been obtained.  Description of procedure:  The patient was taken to the operating room and general anesthesia was induced.  The patient was placed in the dorsal lithotomy position, prepped and draped in the usual sterile fashion, and preoperative antibiotics were administered. A preoperative time-out was performed.    The rigid 21 French cystoscope was advanced per urethra into the bladder. The prostatic fossa was elongated, approximately 7 cm with severe trilobar coaptation. The bladder neck was significantly elevated with a very large median lobe protruding into the bladder. Inspection of the bladder mucosa revealed moderate trabeculation with some saccules. On the right posterior wall, there were some low-grade papillary tumor which was not appreciated on his previous flexible cystoscopy. This measured approximately 2 cm in diameter, broad-based.  There were no other appreciable tumors. At this point in time, cold cup biopsy forceps were used to resect this tumor and piecewise fashion down to the level of the muscularis. A 26 French resectoscope was then brought in and using the loop, the base of the tumor was fulgurated using bipolar energy. X  Next, the 550  laser fiber was brought in and using settings of 2 J and 50 Hz, 2 incisions were created on either side at the very large median lobe at the level of the bladder neck which were carried down distally just proximal to the verumontanum with a were met in the midline. Each of these incisions was then deepened down to the level of the capsule. Of note, that mucosa and prostate tissue was extremely friable. Visualization was somewhat poor. The median lobe was then enucleated in a caudal to cranial direction pulling the lobe towards the bladder neck. Eventually, the mucosa at the bladder neck was examined to be cleaved in the median lobe was freed. The median lobe was massive. This opened up the prostatic fossa quite nicely. The lateral lobes are still exceedingly large, however, given the poor visualization and size of the median lobe and ball-valve appearance, integration at the median lobe was deemed adequate and the lateral lobes were not addressed on this  occasion.  Next, a nephroscope was brought in and the parotid morcellator was used to carefully morcellated  the enucleated median left. Care was taken to ensure that the bladder was adequately distended and to avoid injury to the bladder itself. Again, due to fairly significant bleeding, visualization was somewhat poor. A second irrigation was brought in to help facilitate morcellation. Ultimately, was successfully morcellating this lobe. Finally, the bipolar loop was brought in and attempts to achieve hemostasis at the bladder neck. There was no significant well-formed clot on the surface of the prostatic fossa which was somewhat adherent. At this point, the nephroscope/resectoscope were removed and a 24 French hematuria 3-way Foley catheter was placed over a catheter guide. The balloon was filled with 75 cc of water and placed on traction. The patient was started on CBI given the appearance of the urine.  He is cleaned and dried, reversed from anesthesia, and taken to the PACU in stable condition.  [name], M.D.

## 2016-04-08 NOTE — Discharge Summary (Signed)
Date of admission: 04/07/2016  Date of discharge: 04/08/2016  Admission diagnosis: 1. BPH with BOO      2. Urinary retention      3. History of left upper tract urothelial carcinoma status post nephroureterectomy  Discharge diagnosis: Same as above                 4. Bladder tumor (2 cm right posterior wall)  Secondary diagnoses:  Patient Active Problem List   Diagnosis Date Noted  . Bladder tumor 04/07/2016  . BPH (benign prostatic hypertrophy) with urinary retention 03/06/2016  . Urothelial carcinoma of kidney ([place]) 03/06/2016  . Elevated PSA 03/06/2016  . Brachial neuritis 06/16/2012  . Thoracic and lumbosacral neuritis 06/16/2012  . Chronic obstructive pulmonary disease ([place]) 12/24/2011  . 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 LBP (low back pain) 12/24/2011  . Dermatitis, eczematoid 08/27/2011  . Benign prostatic hyperplasia with urinary obstruction 08/25/2011  . Restless leg 08/25/2011  . 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 HLD (hyperlipidemia) 06/20/2011  . AI (aortic incompetence) 06/20/2011    History and Physical: For full details, please see admission history and physical. Briefly, Eric Mullins is a 81 y.o. year old patient with BPH with BOO, urinary retention, history of left upper tract urothelial carcinoma status post nephroureterectomy and bladder tumor who underwent cystoscopy, right retrograde, TURBT and HoLEP on 04/07/2016.  Intraoperative findings were low-grade appearing papillary cavity involving 2 cm on the right posterior wall. Massive BPH with very large intravesical median lobe. Trabeculated bladder. Normal right retrograde pyelogram. Left UO surgically absent. Vascularity prosthetic fossa with fairly significant bleeding.  Hospital Course: Patient experienced continuous bleeding post operatively. He was then taken back for cystoscopy, fulguration of the bladder neck and prostate fossa and clot evacuation. He was then placed on CBI and urine remained clear.   He was then transferred to the floor after an uneventful PACU stay.  His hospital course was uncomplicated.  On POD# one he remained nothing by mouth during the night.  He states he is hungry and thirsty this morning. He is resumed on regular diet.  Urine is clearing on CBI, so CBI is clamped.  He will be discharged with a Foley catheter.   Laboratory values:   Recent Labs  04/07/16 1412 04/07/16 1741 04/08/16 0354  WBC 11.7*  --  8.9  HGB 12.8* 11.1* 9.6*  HCT 38.7* 33.9* 28.5*    Recent Labs  04/07/16 1412 04/08/16 0354  NA 138 139  K 5.4* 4.4  CL 110 108  CO2 25 24  GLUCOSE 127* 123*  BUN 21* 21*  CREATININE 1.27* 1.19  CALCIUM 8.6* 8.3*   No results for input(s): LABPT, INR in the last 72 hours. No results for input(s): LABURIN in the last 72 hours. Results for orders placed or performed in visit on 03/28/16  CULTURE, URINE COMPREHENSIVE     Status: None   Collection Time: 03/28/16  3:13 PM  Result Value Ref Range Status   Urine Culture, Comprehensive Final report  Final   Result 1 Comment  Final    Comment: Mixed urogenital flora Greater than 100,000 colony forming units per mL   Microscopic Examination     Status: Abnormal   Collection Time: 03/28/16  3:13 PM  Result Value Ref Range Status   WBC, UA None seen 0 -  5 /hpf Final   RBC, UA >30 (A) 0 -  2 /hpf Final   Epithelial Cells (non renal) 0-10 0 - 10 /  hpf Final   Bacteria, UA None seen None seen/Few Final   Physical exam Blood pressure 114/38, pulse 63, temperature 97.8 F (36.6 C), temperature source Oral, resp. rate 20, height 5\' 8"  (1.727 m), weight 145 lb (65.772 kg), SpO2 97 %. Constitutional: Well nourished. Alert and oriented, No acute distress. HEENT: [place] AT, moist mucus membranes. Trachea midline, no masses. Cardiovascular: No clubbing, cyanosis, or edema. Respiratory: Normal respiratory effort, no increased work of breathing. GI: Abdomen is soft, non tender, non distended, no abdominal  masses.    GU: No CVA tenderness.  No bladder fullness or masses.  Foley in place.  Draining pink clear urine 15 minutes after the discontinuation of CBI. Skin: No rashes, bruises or suspicious lesions. Lymph: No cervical or inguinal adenopathy. Neurologic: Grossly intact, no focal deficits, moving all 4 extremities. Psychiatric: Normal mood and affect.  Disposition: Home  Discharge instruction: The patient was instructed to be ambulatory but told to refrain from heavy lifting, strenuous activity, or driving. Specifically, he is instructed not to mow.    Discharge medications:   Medication List    TAKE these medications        amLODipine 5 MG tablet  Commonly known as:  NORVASC  Take 5 mg by mouth at bedtime.     aspirin 81 MG tablet  Take 81 mg by mouth daily.     EYE HEALTH Caps  Take by mouth.     ferrous sulfate 324 (65 Fe) MG Tbec  Take 1 tablet by mouth daily.     finasteride 5 MG tablet  Commonly known as:  PROSCAR  Take 1 tablet (5 mg total) by mouth daily.     finasteride 5 MG tablet  Commonly known as:  PROSCAR  Take 1 tablet (5 mg total) by mouth daily.     Fluticasone-Salmeterol 250-50 MCG/DOSE Aepb  Commonly known as:  ADVAIR  Inhale 1 puff into the lungs 2 (two) times daily.     gabapentin 300 MG capsule  Commonly known as:  NEURONTIN  Take 300 mg by mouth at bedtime. Reported on 03/06/2016     ipratropium-albuterol 0.5-2.5 (3) MG/3ML Soln  Commonly known as:  DUONEB  Take 3 mLs by nebulization every 4 (four) hours as needed.     lisinopril 10 MG tablet  Commonly known as:  PRINIVIL,ZESTRIL  Take 10 mg by mouth daily.     metoprolol tartrate 25 MG tablet  Commonly known as:  LOPRESSOR  Take 25 mg by mouth 2 (two) times daily.     nitrofurantoin (macrocrystal-monohydrate) 100 MG capsule  Commonly known as:  MACROBID  Take 1 capsule (100 mg total) by mouth daily.     nitrofurantoin (macrocrystal-monohydrate) 100 MG capsule  Commonly known as:   MACROBID  Take 1 capsule (100 mg total) by mouth daily.     pravastatin 40 MG tablet  Commonly known as:  PRAVACHOL  Take 40 mg by mouth at bedtime.     PROAIR HFA 108 (90 Base) MCG/ACT inhaler  Generic drug:  albuterol     ranitidine 75 MG tablet  Commonly known as:  ZANTAC  Take 75 mg by mouth 2 (two) times daily as needed. Reported on 03/06/2016     rOPINIRole 1 MG tablet  Commonly known as:  REQUIP     tamsulosin 0.4 MG Caps capsule  Commonly known as:  FLOMAX  Take 1 capsule (0.4 mg total) by mouth daily after supper.     tamsulosin 0.4 MG Caps  capsule  Commonly known as:  FLOMAX  Take 1 capsule (0.4 mg total) by mouth daily after supper.     triamcinolone cream 0.1 %  Commonly known as:  KENALOG  Apply 1 application topically 2 (two) times daily.        Followup:      Follow-up Information    Follow up with [name], MD In 1 week.   Specialty:  Urology   Why:  For a voiding trial and follow up    Contact information:   [place] [address] [phone]

## 2016-04-08 NOTE — Progress Notes (Signed)
CBI continued with output lightening up w/o clots noted overnight; Barbaraann Faster, RN 5:59 AM 04/08/2016

## 2016-04-08 NOTE — Progress Notes (Signed)
Patient being discharged home as per order, discharged instruction provided, iv removed. Foley catheter remains in place  as per order,  Patient instructed about catheter care as per order plus handout given to patient about cathter care .

## 2016-04-08 NOTE — Progress Notes (Signed)
Patient oob sitting in the recliner chair, family at bedside. Hematuria in the foley, denies pain or discomfort at this time

## 2016-04-08 NOTE — Progress Notes (Signed)
Patient resting in bed at this time, bedside report received, patient denies any pain or discomfort, mood calm. Family at bedside, CBI on hold as per MD this am.

## 2016-04-08 NOTE — Anesthesia Postprocedure Evaluation (Signed)
Anesthesia Post Note  Patient: Eric Mullins  Procedure(s) Performed: Procedure(s) (LRB): Fulgeration and evacuation of clot (N/A)  Patient location during evaluation: PACU Anesthesia Type: General Level of consciousness: awake and alert Pain management: pain level controlled Vital Signs Assessment: post-procedure vital signs reviewed and stable Respiratory status: spontaneous breathing, nonlabored ventilation, respiratory function stable and patient connected to nasal cannula oxygen Cardiovascular status: blood pressure returned to baseline and stable Postop Assessment: no signs of nausea or vomiting Anesthetic complications: no    Last Vitals:  Filed Vitals:   04/08/16 0114 04/08/16 0422  BP: 129/44 114/38  Pulse: 59 63  Temp: 36.4 C 36.6 C  Resp: 20 20    Last Pain:  Filed Vitals:   04/08/16 0423  PainSc: 0-No pain                 Martha Clan

## 2016-04-09 LAB — TYPE AND SCREEN
ABO/RH(D): AB POS
ANTIBODY SCREEN: NEGATIVE
UNIT DIVISION: 0
Unit division: 0

## 2016-04-09 LAB — PREPARE RBC (CROSSMATCH)

## 2016-04-09 LAB — SURGICAL PATHOLOGY

## 2016-04-11 ENCOUNTER — Encounter: Payer: Self-pay | Admitting: Urology

## 2016-04-15 ENCOUNTER — Ambulatory Visit (INDEPENDENT_AMBULATORY_CARE_PROVIDER_SITE_OTHER): Payer: PPO

## 2016-04-15 DIAGNOSIS — N400 Enlarged prostate without lower urinary tract symptoms: Secondary | ICD-10-CM | POA: Diagnosis not present

## 2016-04-15 DIAGNOSIS — N401 Enlarged prostate with lower urinary tract symptoms: Secondary | ICD-10-CM

## 2016-04-15 DIAGNOSIS — R338 Other retention of urine: Principal | ICD-10-CM

## 2016-04-15 LAB — BLADDER SCAN AMB NON-IMAGING: Scan Result: 165

## 2016-04-15 NOTE — Progress Notes (Signed)
Simple Catheter Placement  Due to urinary retention patient is present today for a foley cath placement.  Patient was cleaned and prepped in a sterile fashion with betadine and lidocaine jelly 2% was instilled into the urethra.  A 18 FR coude foley catheter was inserted, urine return was noted  238ml, urine was clear and yellow in color.  The balloon was filled with 10cc of sterile water.  A leg bag was attached for drainage. Patient was also given a night bag to take home and was given instruction on how to change from one bag to another.  Patient was given instruction on proper catheter care.  Patient tolerated well, no complications were noted   Preformed by: Toniann Fail, LPN  Additional notes/ Follow up: Pt called stating he was very tender in his lower abd. PVR 165. Per Dr. Erlene Quan foley was replaced. Pt will RTC in 1 week for voiding trial.

## 2016-04-15 NOTE — Addendum Note (Signed)
Addended by: Lestine Box on: 04/15/2016 04:28 PM   Modules accepted: Orders

## 2016-04-15 NOTE — Progress Notes (Signed)
Fill and Pull Catheter Removal  Patient is present today for a catheter removal.  Patient was cleaned and prepped in a sterile fashion 2106ml of sterile water/ saline was instilled into the bladder when the patient felt the urge to urinate. 41ml of water was then drained from the balloon.  A 24FR foley cath was removed from the bladder no complications were noted .  Patient as then given some time to void on their own.  Patient can void  129ml on their own after some time.  Patient tolerated well.  Preformed by: Toniann Fail, LPN  Follow up/ Additional notes: Reinforced with pt to go home and drink plenty of fluids. Reinforced with pt if he is not able to urinate by 3:00pm today he needs to call and come back to the office. Pt voiced understanding.

## 2016-04-22 ENCOUNTER — Ambulatory Visit (INDEPENDENT_AMBULATORY_CARE_PROVIDER_SITE_OTHER): Payer: PPO

## 2016-04-22 DIAGNOSIS — R339 Retention of urine, unspecified: Secondary | ICD-10-CM

## 2016-04-22 NOTE — Progress Notes (Signed)
Fill and Pull Catheter Removal  Patient is present today for a catheter removal.  Patient was cleaned and prepped in a sterile fashion 237ml of sterile water/ saline was instilled into the bladder when the patient felt the urge to urinate. 41ml of water was then drained from the balloon.  A 18FR coude foley cath was removed from the bladder no complications were noted .  Patient as then given some time to void on their own.  Patient can void  239ml on their own after some time.  Patient tolerated well.  Preformed by: Toniann Fail, LPN   Follow up/ Additional notes: Reinforced with pt to drink plenty of fluids today and if not able urinate to come back to our office. Pt voiced understanding.

## 2016-04-29 ENCOUNTER — Telehealth: Payer: Self-pay | Admitting: Urology

## 2016-04-29 NOTE — Telephone Encounter (Signed)
Spoke with pt wife in reference to pathology report. Per Dr. Erlene Quan pt has low grade non-invasive bladder tumor. Dr. Erlene Quan ordered a cysto every 52mo to follow up on tumor. Made wife aware. Wife voiced understanding.

## 2016-04-29 NOTE — Telephone Encounter (Signed)
Please advise 

## 2016-04-29 NOTE — Telephone Encounter (Signed)
Pt had surgery to remove a tumor. Pt's wife was wondering if the results of that tumor had come back. Please advise

## 2016-04-30 DIAGNOSIS — N400 Enlarged prostate without lower urinary tract symptoms: Secondary | ICD-10-CM | POA: Diagnosis not present

## 2016-04-30 DIAGNOSIS — I2510 Atherosclerotic heart disease of native coronary artery without angina pectoris: Secondary | ICD-10-CM | POA: Diagnosis not present

## 2016-04-30 DIAGNOSIS — D696 Thrombocytopenia, unspecified: Secondary | ICD-10-CM | POA: Diagnosis not present

## 2016-04-30 DIAGNOSIS — Z23 Encounter for immunization: Secondary | ICD-10-CM | POA: Diagnosis not present

## 2016-04-30 DIAGNOSIS — F17200 Nicotine dependence, unspecified, uncomplicated: Secondary | ICD-10-CM | POA: Diagnosis not present

## 2016-04-30 DIAGNOSIS — D649 Anemia, unspecified: Secondary | ICD-10-CM | POA: Diagnosis not present

## 2016-04-30 DIAGNOSIS — I498 Other specified cardiac arrhythmias: Secondary | ICD-10-CM | POA: Diagnosis not present

## 2016-04-30 DIAGNOSIS — I10 Essential (primary) hypertension: Secondary | ICD-10-CM | POA: Diagnosis not present

## 2016-04-30 DIAGNOSIS — E782 Mixed hyperlipidemia: Secondary | ICD-10-CM | POA: Diagnosis not present

## 2016-05-16 ENCOUNTER — Encounter: Payer: Self-pay | Admitting: Urology

## 2016-05-16 ENCOUNTER — Ambulatory Visit (INDEPENDENT_AMBULATORY_CARE_PROVIDER_SITE_OTHER): Payer: PPO | Admitting: Urology

## 2016-05-16 VITALS — BP 152/70 | HR 73 | Ht 68.0 in | Wt 138.0 lb

## 2016-05-16 DIAGNOSIS — N401 Enlarged prostate with lower urinary tract symptoms: Secondary | ICD-10-CM

## 2016-05-16 DIAGNOSIS — N400 Enlarged prostate without lower urinary tract symptoms: Secondary | ICD-10-CM | POA: Diagnosis not present

## 2016-05-16 DIAGNOSIS — R338 Other retention of urine: Principal | ICD-10-CM

## 2016-05-16 DIAGNOSIS — R972 Elevated prostate specific antigen [PSA]: Secondary | ICD-10-CM

## 2016-05-16 DIAGNOSIS — C642 Malignant neoplasm of left kidney, except renal pelvis: Secondary | ICD-10-CM

## 2016-05-16 NOTE — Progress Notes (Signed)
Uroflow  Peak Flow: 47ml Average Flow: 9.92ml Voided Volume: 145ml Voiding Time: 16.6sec Flow Time: 16.6sec Time to Peak Flow: 2.8sec  PVR Volume: 195ml

## 2016-05-19 ENCOUNTER — Encounter: Payer: Self-pay | Admitting: Urology

## 2016-05-19 NOTE — Progress Notes (Signed)
12:13 PM  04/15/16  Eric Mullins September 14, 1935 [id number]  Referring provider: Nino Glow McLean-Scocozza, MD Eric Mullins, [name] [address]  Chief Complaint  Patient presents with  . Benign Prostatic Hypertrophy    HPI: 81 yo M with history of gross hematuria with massive BPH s/p HoLEP on 04/07/16 , elevated PSA, and left upper tract TCC s/p lap nephroU 11/2014 with incidental bladder recurrence identified at time of HoLEP.  History of upper tract TCC/ Bladder cancer He was  found to have left upper pole filling defect suspicious for upper tract TCC on CT Urogram 06/2014. Further workup of this lesion revealed a papillary upper pole lesion consistent with upper tract TCC. Given the inability to easily survey this lesion endoscopically, he underwent left hand-assisted laparoscopic nephroureterectomy on 11/20/2014.   Surgical pathology showed a low-grade superficial papillary ( inverted architecture) without evidence of invasion. Single lymph node was negative for malignancy. pTaN0Mx   Surveillance RUS wnl on 03/10/16 with stable complex cyst 6.4 cm.    Incidental 2 cm papillary bladder tumor posterior bladder wall identified at the time of holmium laser enucleation of the prostate on 04/07/2016.  Pathology consistent with low-grade, noninvasive papillary TCC. Lamina propria was present and uninvolved.  Elevated PSA He underwent prostate biopsy on 07/18/14 which was negative for malignancy in setting of elevated PSA to 17.7 (07/03/14), repeat 7.7 ng/dL in 02/10/15. TRUS vol 149 cc with large median lobe noted.   Urinary retention Developed urinary retention 03/01/16, failed VT x multiple on maximal medical management.  Unable to self cath due to prostate anatomy.  He was taken to the operating room on 04/07/2016 for holmium laser enucleation of the prostate, primarily treating his very large median lobe. He since pass a voiding trial and is doing quite well in terms of his  urination. His stream is excellent and is able to empty his bladder for the most part. He denies any urinary frequency or urgency. No gross hematuria or dysuria. He is very pleased with the outcome.  Surgical pathology shows 79 g of prostate tissue, consistent with benign prostatic tissue with stromal and glandular hyperplasia along with chronic inflammation.   PMH: Past Medical History  Diagnosis Date  . Hypertension   . Hyperlipidemia   . COPD (chronic obstructive pulmonary disease) ([place])   . GERD (gastroesophageal reflux disease)   . Urinary tract infection   . Paraphimosis   . Gross hematuria   . Kidney filling defect   . BPH (benign prostatic hyperplasia)   . Encounter for removal of urinary catheter   . 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 Elevated PSA   . Incomplete bladder emptying   . Heart murmur   . Stroke [facility]) 2010    Surgical History: Past Surgical History  Procedure Laterality Date  . Ear mastoidectomy w/ cochlear implant w/ landmark    . Total knee arthroplasty Left   . Elbow surgery Left     ball and joint replaced  . Total nephrectomy Left 11/2013  . Foot surgery Left 1980    lawn mower accident  . Back surgery      lower back. patient unsure if there is metal in back  . Cataract extraction w/ intraocular lens  implant, bilateral Bilateral   . Cochlear implant Left     middle ear implant  . Cystoscopy N/A 04/07/2016    Procedure: Fulgeration and evacuation of clot;  Surgeon: [name], MD;  Location: [facility]  ORS;  Service: Urology;  Laterality: N/A;  . Cystoscopy w/ retrogrades Right 04/07/2016    Procedure: CYSTOSCOPY WITH RETROGRADE PYELOGRAM;  Surgeon: [name], MD;  Location: [facility] ORS;  Service: Urology;  Laterality: Right;  . Holep-laser enucleation of the prostate with morcellation N/A 04/07/2016    Procedure: HOLEP-LASER ENUCLEATION OF THE PROSTATE WITH MORCELLATION/ TURBT;  Surgeon: [name], MD;  Location: [facility] ORS;  Service:  Urology;  Laterality: N/A;    Home Medications:    Medication List       This list is accurate as of: 05/16/16 11:59 PM.  Always use your most recent med list.               amLODipine 5 MG tablet  Commonly known as:  NORVASC  Take 5 mg by mouth at bedtime.     aspirin 81 MG tablet  Take 81 mg by mouth daily.     EYE HEALTH Caps  Take by mouth.     ferrous sulfate 324 (65 Fe) MG Tbec  Take 1 tablet by mouth daily.     finasteride 5 MG tablet  Commonly known as:  PROSCAR  Take 1 tablet (5 mg total) by mouth daily.     Fluticasone-Salmeterol 250-50 MCG/DOSE Aepb  Commonly known as:  ADVAIR  Inhale 1 puff into the lungs 2 (two) times daily.     gabapentin 300 MG capsule  Commonly known as:  NEURONTIN  Take 300 mg by mouth at bedtime. Reported on 03/06/2016     ipratropium-albuterol 0.5-2.5 (3) MG/3ML Soln  Commonly known as:  DUONEB  Take 3 mLs by nebulization every 4 (four) hours as needed.     lisinopril 10 MG tablet  Commonly known as:  PRINIVIL,ZESTRIL  Take 10 mg by mouth daily.     metoprolol tartrate 25 MG tablet  Commonly known as:  LOPRESSOR  Take 25 mg by mouth 2 (two) times daily.     pravastatin 40 MG tablet  Commonly known as:  PRAVACHOL  Take 40 mg by mouth at bedtime.     PROAIR HFA 108 (90 Base) MCG/ACT inhaler  Generic drug:  albuterol     ranitidine 75 MG tablet  Commonly known as:  ZANTAC  Take 75 mg by mouth 2 (two) times daily as needed. Reported on 03/06/2016     rOPINIRole 1 MG tablet  Commonly known as:  REQUIP     triamcinolone cream 0.1 %  Commonly known as:  KENALOG  Apply 1 application topically 2 (two) times daily.        Allergies: No Known Allergies  Family History: Family History  Problem Relation Age of Onset  . Brain cancer Father   . Lung cancer Father   . Kidney disease Neg Hx   . Prostate cancer Neg Hx     Social History:  reports that he has been smoking Pipe.  He has never used smokeless tobacco. He  reports that he does not drink alcohol or use illicit drugs.   Physical Exam: BP 152/70 mmHg  Pulse 73  Ht 5\' 8"  (1.727 m)  Wt 138 lb (62.596 kg)  BMI 20.99 kg/m2  Constitutional:  Alert and oriented, No acute distress.  Accompanied to the office today by his wife. HEENT: [place] AT, moist mucus membranes.  Trachea midline, no masses. Cardiovascular: No clubbing, cyanosis, or edema. Respiratory: Normal respiratory effort, no increased work of breathing. GI: Abdomen is soft, nontender, nondistended, no abdominal masses GU: No CVA tenderness.  Normal phallus with orthotopic meatus.   Skin: No rashes, bruises or suspicious lesions. Neurologic: Grossly intact, no focal deficits, moving all 4 extremities. Psychiatric: Normal mood and affect.  Laboratory Data: Lab Results  Component Value Date   WBC 8.9 04/08/2016   HGB 9.6* 04/08/2016   HCT 28.5* 04/08/2016   MCV 89.8 04/08/2016   PLT 113* 04/08/2016    Lab Results  Component Value Date   CREATININE 1.19 04/08/2016   Imaging: Results for orders placed or performed in visit on 04/15/16  BLADDER SCAN AMB NON-IMAGING  Result Value Ref Range   Scan Result 165    Uroflow  Peak Flow: 29ml  Average Flow: 9.76ml  Voided Volume: 124ml  Voiding Time: 16.6sec  Flow Time: 16.6sec  Time to Peak Flow: 2.8sec  PVR Volume: 145ml  Curve with nice parabolic curve, no evidence of obstructive voiding pattern  Assessment & Plan:   1. Urothelial carcinoma of kidney, left/ History of bladder cancer S/p L laparoscopic nephroureterectomy 11/2014. Surgical pathology showed a low-grade superficial papillary ( inverted architecture) without evidence of invasion. Single lymph node was negative for malignancy. pTaN0Mx.  Bladder recurrence with 2 cm tumor, LgTa TCC 04/07/16 -plan for repeat cysto on 2 months for surviellence   2. Benign prostatic hypertrophy (BPH) with incomplete bladder emptying/ urinary retention S/p HoLEP of median  lobe Excellent uroflow with adequate bladder emptying today Symptomatically improved OK to stop Flomax and finasteride this point, if symptoms recur will resume  3. Elevated PSA S/p prostate biopsy on 07/18/14 which was negative for malignancy in setting of elevated PSA to 17.7 (07/03/14), repeat 7.7 ng/dL in 02/10/15.   Return in about 2 months (around 07/16/2016) for cystoscopy.  [name], MD  [facility] [address], [place], [place] [address] [phone]

## 2016-05-26 DIAGNOSIS — D649 Anemia, unspecified: Secondary | ICD-10-CM | POA: Diagnosis not present

## 2016-05-26 DIAGNOSIS — D696 Thrombocytopenia, unspecified: Secondary | ICD-10-CM | POA: Diagnosis not present

## 2016-05-26 DIAGNOSIS — I10 Essential (primary) hypertension: Secondary | ICD-10-CM | POA: Diagnosis not present

## 2016-05-26 DIAGNOSIS — I498 Other specified cardiac arrhythmias: Secondary | ICD-10-CM | POA: Diagnosis not present

## 2016-07-09 ENCOUNTER — Other Ambulatory Visit: Payer: PPO | Admitting: Urology

## 2016-07-11 ENCOUNTER — Other Ambulatory Visit: Payer: PPO | Admitting: Urology

## 2016-07-17 ENCOUNTER — Ambulatory Visit (INDEPENDENT_AMBULATORY_CARE_PROVIDER_SITE_OTHER): Payer: PPO | Admitting: Urology

## 2016-07-17 ENCOUNTER — Encounter: Payer: Self-pay | Admitting: Urology

## 2016-07-17 VITALS — BP 160/64 | HR 70 | Ht 68.0 in | Wt 146.0 lb

## 2016-07-17 DIAGNOSIS — C642 Malignant neoplasm of left kidney, except renal pelvis: Secondary | ICD-10-CM

## 2016-07-17 DIAGNOSIS — R972 Elevated prostate specific antigen [PSA]: Secondary | ICD-10-CM

## 2016-07-17 DIAGNOSIS — R3914 Feeling of incomplete bladder emptying: Secondary | ICD-10-CM

## 2016-07-17 DIAGNOSIS — N401 Enlarged prostate with lower urinary tract symptoms: Secondary | ICD-10-CM

## 2016-07-17 LAB — URINALYSIS, COMPLETE
BILIRUBIN UA: NEGATIVE
Glucose, UA: NEGATIVE
KETONES UA: NEGATIVE
NITRITE UA: POSITIVE — AB
PH UA: 5.5 (ref 5.0–7.5)
Specific Gravity, UA: 1.015 (ref 1.005–1.030)
UUROB: 0.2 mg/dL (ref 0.2–1.0)

## 2016-07-17 LAB — MICROSCOPIC EXAMINATION

## 2016-07-17 MED ORDER — LIDOCAINE HCL 2 % EX GEL
1.0000 "application " | Freq: Once | CUTANEOUS | Status: AC
  Administered 2016-07-17: 1 via URETHRAL

## 2016-07-17 MED ORDER — CIPROFLOXACIN HCL 500 MG PO TABS
500.0000 mg | ORAL_TABLET | Freq: Once | ORAL | Status: AC
  Administered 2016-07-17: 500 mg via ORAL

## 2016-07-17 NOTE — Progress Notes (Signed)
10:15 AM  07/17/16  Eric Mullins 03/05/1935 [id number]  Referring provider: Nino Glow McLean-Scocozza, MD [place], [place] [address]  Chief Complaint  Patient presents with  . 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  History of upper tract TCC/ Bladder cancer He was  found to have left upper pole filling defect suspicious for upper tract TCC on CT Urogram 06/2014. Further workup of this lesion revealed a papillary upper pole lesion consistent with upper tract TCC. Given the inability to easily survey this lesion endoscopically, he underwent left hand-assisted laparoscopic nephroureterectomy on 11/20/2014.   Surgical pathology showed a low-grade superficial papillary ( inverted architecture) without evidence of invasion. Single lymph node was negative for malignancy. pTaN0Mx   Surveillance RUS wnl on 03/10/16 with stable complex cyst 6.4 cm.    Incidental 2 cm papillary bladder tumor posterior bladder wall identified at the time of holmium laser enucleation of the prostate on 04/07/2016.  Pathology consistent with low-grade, noninvasive papillary TCC. Lamina propria was present and uninvolved.  Elevated PSA He underwent prostate biopsy on 07/18/14 which was negative for malignancy in setting of elevated PSA to 17.7 (07/03/14), repeat 7.7 ng/dL in 02/10/15. TRUS vol 149 cc with large median lobe noted.   History of BPH/ urinary retention Developed urinary retention 03/01/16, failed VT x multiple on maximal medical management.  Unable to self cath due to prostate anatomy.  He was taken to the operating room on 04/07/2016 for holmium laser enucleation of the prostate, primarily treating his very large median lobe. Voiding well since surgery.    Surgical pathology shows 79 g of prostate  tissue, consistent with benign prostatic tissue with stromal and glandular hyperplasia along with chronic inflammation.   PMH: Past Medical History:  Diagnosis Date  . BPH (benign prostatic hyperplasia)   . COPD (chronic obstructive pulmonary disease) ([place])   . Elevated PSA   . Encounter for removal of urinary catheter   . GERD (gastroesophageal reflux disease)   . Gross hematuria   . Heart murmur   . Hyperlipidemia   . Hypertension   . Incomplete bladder emptying   . Kidney filling defect   . Paraphimosis   . Primary cancer of left renal pelvis ([place])   . Stroke ([place]) 2010  . Urinary tract infection     Surgical History: Past Surgical History:  Procedure Laterality Date  . BACK SURGERY     lower back. patient unsure if there is metal in back  . CATARACT EXTRACTION W/ INTRAOCULAR LENS  IMPLANT, BILATERAL Bilateral   . COCHLEAR IMPLANT Left    middle ear implant  . CYSTOSCOPY N/A 04/07/2016   Procedure: Fulgeration and evacuation of clot;  Surgeon: [name], MD;  Location: [facility] ORS;  Service: Urology;  Laterality: N/A;  . CYSTOSCOPY W/ RETROGRADES Right 04/07/2016   Procedure: CYSTOSCOPY WITH RETROGRADE PYELOGRAM;  Surgeon: [name], MD;  Location: [facility] OR[facility];  Service: Urology;  Laterality: Right;  . EAR MASTOIDECTOMY W/ COCHLEAR IMPLANT W/ LANDMARK    . ELBOW SURGERY Left    ball and joint replaced  . FOOT SURGERY Left 1980   lawn mower accident  . HOLEP-LASER ENUCLEATION OF THE PROSTATE WITH MORCELLATION N/A 04/07/2016   Procedure: HOLEP-LASER ENUCLEATION OF THE PROSTATE WITH MORCELLATION/ TURBT;  Surgeon: Caryl Pina  Erlene Quan, MD;  Location: [facility] ORS;  Service: Urology;  Laterality: N/A;  . TOTAL KNEE ARTHROPLASTY Left   . TOTAL NEPHRECTOMY Left 11/2013    Home Medications:    Medication List       Accurate as of 07/17/16 10:15 AM. Always use your most recent med list.          amLODipine 5 MG tablet Commonly known as:  NORVASC Take 5 mg by mouth at bedtime.    aspirin 81 MG tablet Take 81 mg by mouth daily.   EYE HEALTH Caps Take by mouth.   ferrous sulfate 324 (65 Fe) MG Tbec Take 1 tablet by mouth daily.   finasteride 5 MG tablet Commonly known as:  PROSCAR Take 1 tablet (5 mg total) by mouth daily.   Fluticasone-Salmeterol 250-50 MCG/DOSE Aepb Commonly known as:  ADVAIR Inhale 1 puff into the lungs 2 (two) times daily.   gabapentin 300 MG capsule Commonly known as:  NEURONTIN Take 300 mg by mouth at bedtime. Reported on 03/06/2016   ipratropium-albuterol 0.5-2.5 (3) MG/3ML Soln Commonly known as:  DUONEB Take 3 mLs by nebulization every 4 (four) hours as needed.   lisinopril 10 MG tablet Commonly known as:  PRINIVIL,ZESTRIL Take 10 mg by mouth daily.   metoprolol tartrate 25 MG tablet Commonly known as:  LOPRESSOR Take 25 mg by mouth 2 (two) times daily.   pravastatin 40 MG tablet Commonly known as:  PRAVACHOL Take 40 mg by mouth at bedtime.   PROAIR HFA 108 (90 Base) MCG/ACT inhaler Generic drug:  albuterol   ranitidine 75 MG tablet Commonly known as:  ZANTAC Take 75 mg by mouth 2 (two) times daily as needed. Reported on 03/06/2016   rOPINIRole 1 MG tablet Commonly known as:  REQUIP   triamcinolone cream 0.1 % Commonly known as:  KENALOG Apply 1 application topically 2 (two) times daily.       Allergies: No Known Allergies  Family History: Family History  Problem Relation Age of Onset  . Brain cancer Father   . Lung cancer Father   . Kidney disease Neg Hx   . Prostate cancer Neg Hx     Social History:  reports that he has been smoking Pipe.  He has never used smokeless tobacco. He reports that he does not drink alcohol or use drugs.   Physical Exam: BP (!) 160/64   Pulse 70   Ht 5\' 8"  (1.727 m)   Wt 146 lb (66.2 kg)   BMI 22.20 kg/m   Constitutional:  Alert and oriented, No acute distress.  Accompanied to the office today by his wife. HEENT: [place] AT, moist mucus membranes.  Trachea midline, no  masses. Cardiovascular: No clubbing, cyanosis, or edema. Respiratory: Normal respiratory effort, no increased work of breathing. GI: Abdomen is soft, nontender, nondistended, no abdominal masses GU: No CVA tenderness. Normal phallus with orthotopic meatus.   Skin: No rashes, bruises or suspicious lesions. Neurologic: Grossly intact, no focal deficits, moving all 4 extremities. Psychiatric: Normal mood and affect.  Laboratory Data: Lab Results  Component Value Date   WBC 8.9 04/08/2016   HGB 9.6 (L) 04/08/2016   HCT 28.5 (L) 04/08/2016   MCV 89.8 04/08/2016   PLT 113 (L) 04/08/2016    Lab Results  Component Value Date   CREATININE 1.19 04/08/2016      Cystoscopy Procedure Note  Patient identification was confirmed, informed consent was obtained, and patient was prepped using Betadine solution.  Lidocaine jelly was administered  per urethral meatus.    Preoperative abx where received prior to procedure.     Pre-Procedure: - Inspection reveals a normal caliber ureteral meatus.  Procedure: The flexible cystoscope was introduced without difficulty - No urethral strictures/lesions are present. - Enlarged prostate with small posterior false pass just proximal to veru, bilobar coaptation - Normal bladder neck, no longer elevated - Bilateral ureteral orifices identified - Bladder mucosa  reveals no ulcers, tumors, or lesions - No bladder stones - Mild trabeculation  Retroflexion shows interval resolution of large median lobe.  Small protrusion of ? Right lateral lobe just above bladder neck.   Post-Procedure: - Patient tolerated the procedure well   Assessment & Plan:   1. Urothelial carcinoma of kidney, left/ History of bladder cancer S/p L laparoscopic nephroureterectomy 11/2014. Surgical pathology showed a low-grade superficial papillary ( inverted architecture) without evidence of invasion. Single lymph node was negative for malignancy. pTaN0Mx.  Bladder  recurrence with 2 cm tumor, LgTa TCC 04/07/16  Cysto today NED  -plan for repeat cysto on 3 months for surviellence   2. Benign prostatic hypertrophy (BPH) with incomplete bladder emptying/ urinary retention S/p HoLEP of median lobe Symptomatically improved- finasteride/ flomax held  3. Elevated PSA S/p prostate biopsy on 07/18/14 which was negative for malignancy in setting of elevated PSA to 17.7 (07/03/14), repeat 7.7 ng/dL in 02/10/15.   Plan for repeat PSA next visit  Return for cysto/ PSA.  [name], MD  [facility] [address] [place], [place] [address] [phone]

## 2016-08-01 IMAGING — PT NM PET TUM IMG RESTAG (PS) SKULL BASE T - THIGH
1 of 10 series · 1 of 25 positions shown · non-contrast
Comparison: CT scan from 11/13/2014.

CLINICAL DATA: Subsequent treatment strategy for urothelial
neoplasm of the left kidney.

EXAM:
NUCLEAR MEDICINE PET SKULL BASE TO THIGH
TECHNIQUE: 12.3 mCi F-18 FDG was injected intravenously. Full-ring PET imaging
was performed from the skull base to thigh after the radiotracer. CT
data was obtained and used for attenuation correction and anatomic
localization.
FASTING BLOOD GLUCOSE:  Value: 95 mg/dl

[Series 3: ct wb 5.0 b30f · axial · 5.0mm · 0.98mm/px · 1 of 330 slices shown]
[im 330/330  brain]
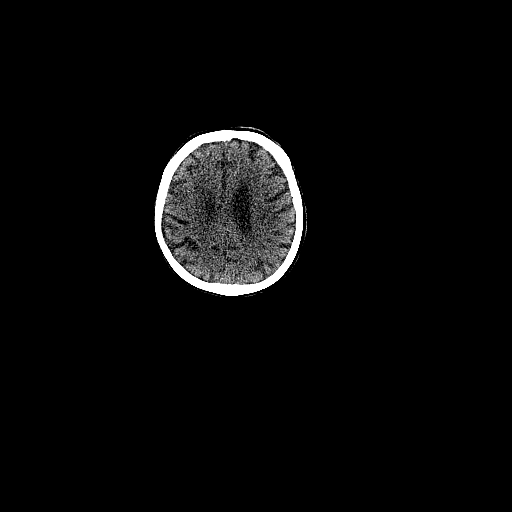

[1 of 25 positions shown; findings below may reference images not displayed]

FINDINGS: NECK

No hypermetabolic lymph nodes in the neck. Asymmetric enlargement of
the right thyroid lobe is noted without hypermetabolism.

CHEST

No hypermetabolic mediastinal or hilar nodes. The borderline
enlarged mediastinal lymph node seen on the previous study decreased
in the interval. 5 mm short axis right paratracheal lymph node seen
on image 96 series 3 today measured 7 mm when remeasured in the same
dimension on the previous study. 10 mm short axis low right
paratracheal lymph node on the previous study (when re measuring) is
9 mm in short axis on image 101 today. These lymph nodes show no
hypermetabolism above background soft tissue levels.

9 mm subpleural right lower lobe nodule is seen on image 108. This
shows no hypermetabolism above background soft tissue levels on PET
imaging. In is stable since the prior study and on old scan from
04/04/2007, this nodule appear to be present, but was in a
background of some compressive atelectasis largely obscuring it. 6
mm anterior right lung nodule (image 114) also shows no substantial
hypermetabolism. Scattered bilateral pulmonary parenchymal nodules
are noted bilaterally but are extremely tiny.

Diffuse centrilobular emphysema is noted bilaterally.

ABDOMEN/PELVIS

No abnormal hypermetabolic activity within the liver, pancreas,
adrenal glands, or spleen. No hypermetabolic lymph nodes in the
abdomen or pelvis.

Right upper pole renal cyst is evident. Left kidney has been
surgically removed in the interval. There is gas in the superficial
soft tissues (subcutaneous fat superficial to the rectus fascia)
over the left abdomen, and by report, the patient is postop day 14
from left nephrectomy. This is longer than expected for retention of
free air in the soft tissues after surgery. There is no evidence for
an associated focal fluid collection to suggest abscess. No gas is
seen within the intraperitoneal space.

Foley catheter is seen in the urinary bladder and gas in the bladder
lumen is compatible with the instrumentation. There is marked
enlargement of the prostate gland.

SKELETON

No focal hypermetabolic activity to suggest skeletal metastasis.
Deformity of the left elbow was better evaluated on a plain film
exam from 09/18/2014. This area is slightly hypermetabolic but could
be related to infection/inflammation or granulation related to
healing.
IMPRESSION: No evidence for hypermetabolic disease in the chest, abdomen, or
pelvis to suggest metastatic involvement.

Multiple bilateral pulmonary nodules, most of which are below the
accepted size threshold for reliable resolution on PET imaging.
Continued attention on followup imaging is recommended.

Free air in the subcutaneous tissues of the left abdominal wall.
Free air within the soft tissues is not unexpected more than about
7-10 days after surgery and this finding is of indeterminate
significance. There is no evidence for associated abscess at this
time. No intraperitoneal free air is present.

## 2016-08-05 IMAGING — US US RENAL
1 series · 13 of 25 positions shown · non-contrast
Comparison: 11/13/2014 CT abdomen/ pelvis.

CLINICAL DATA: Status post left nephrectomy for upper tract
transitional cell carcinoma. Patient presents for screening of the
right kidney. Recent episode of gross hematuria.

EXAM:
RENAL / URINARY TRACT ULTRASOUND COMPLETE

[Series 1: us renal · 0.23mm/px · 13 of 30 slices shown]
[im 1/30]
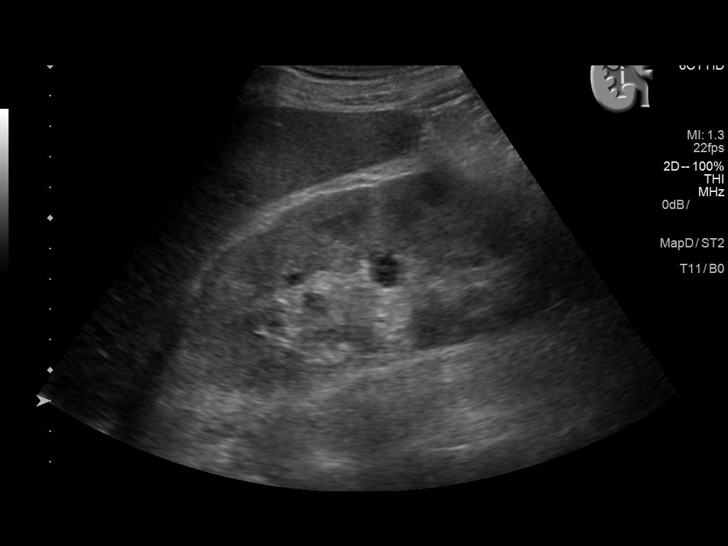
[im 3/30]
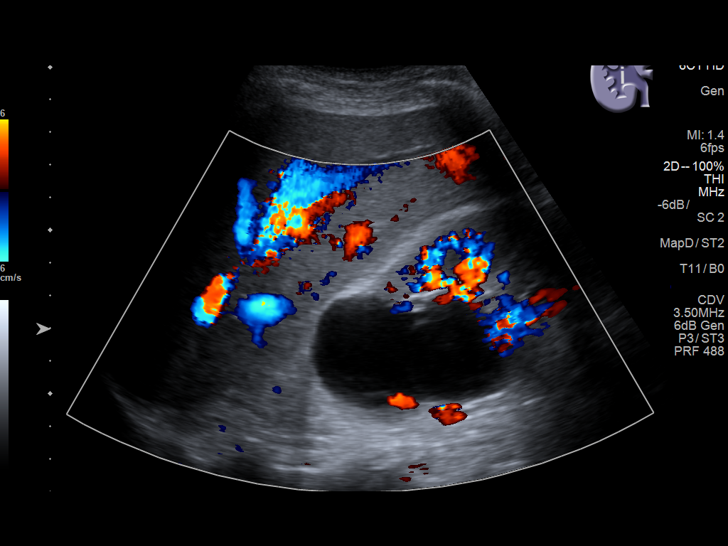
[im 5/30]
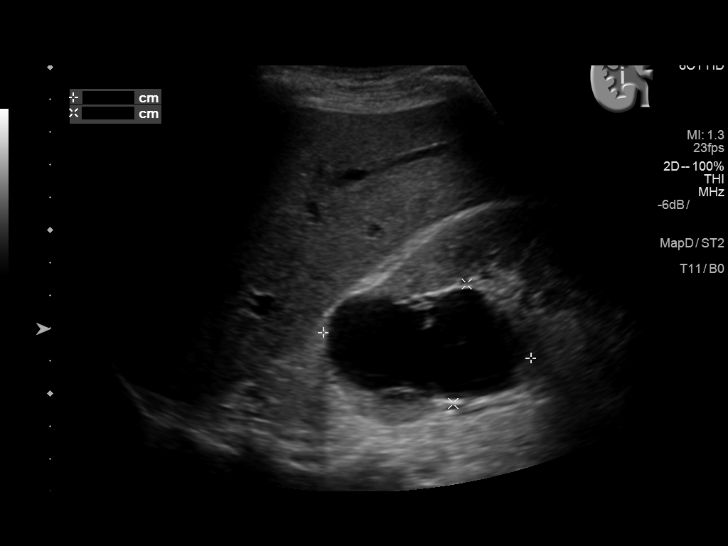
[im 8/30]
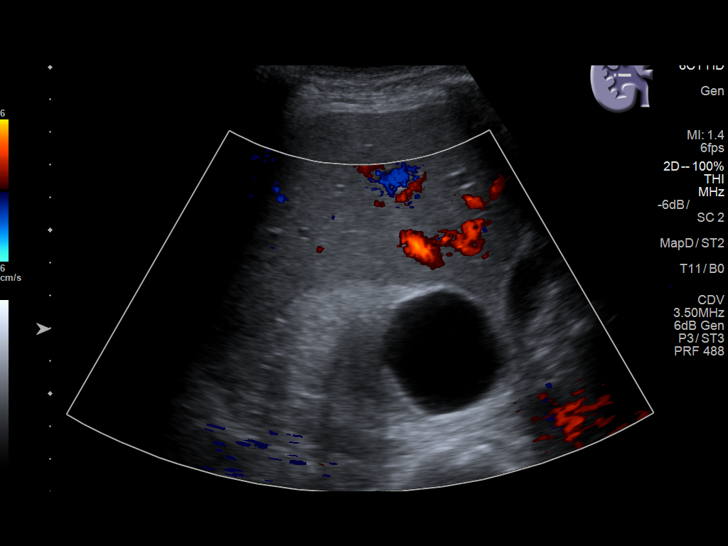
[im 10/30]
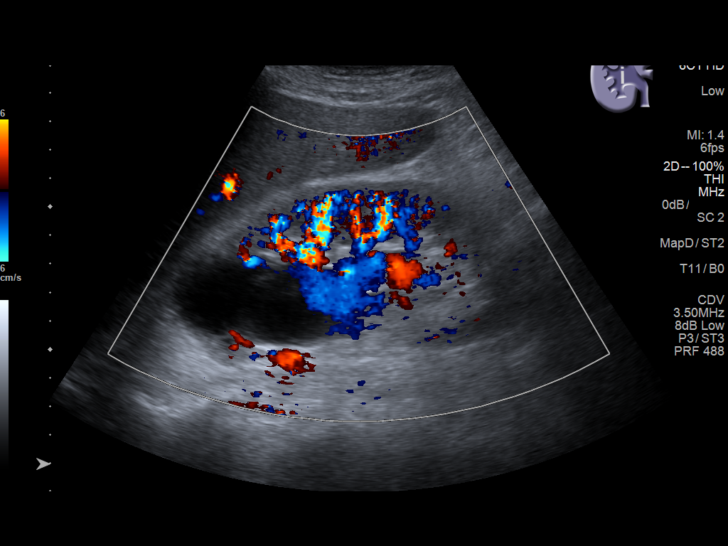
[im 13/30]
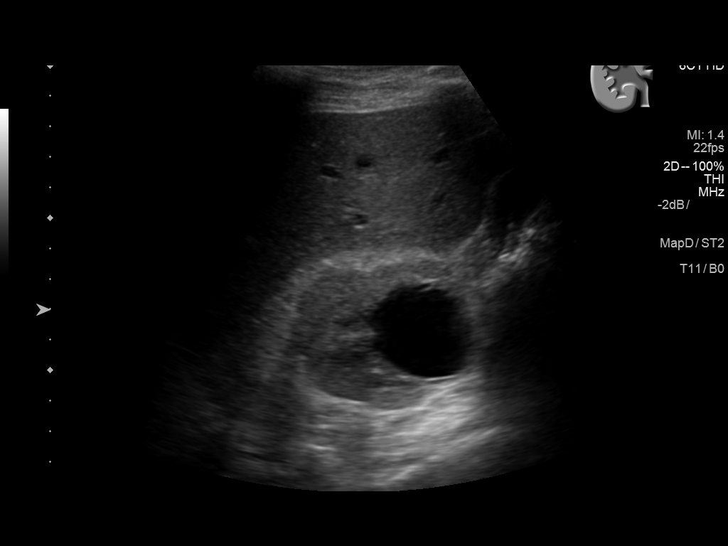
[im 15/30]
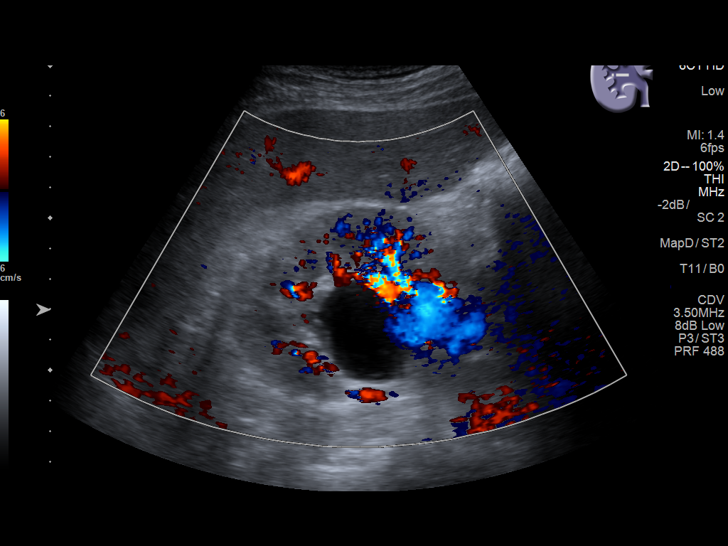
[im 17/30]
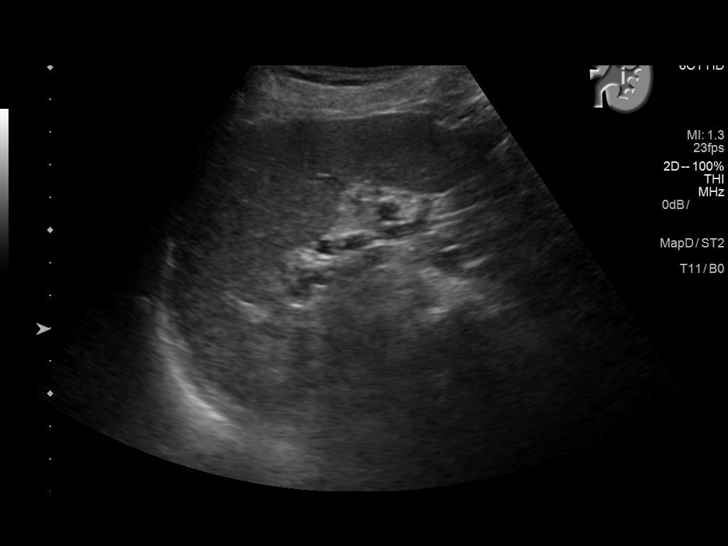
[im 20/30]
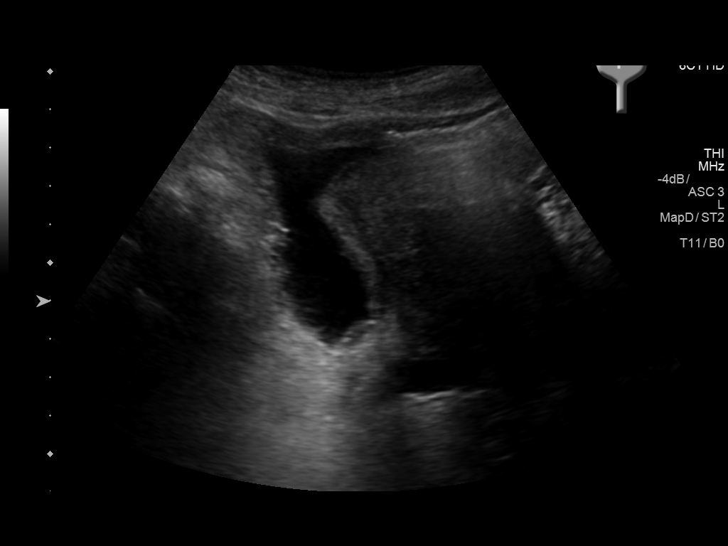
[im 22/30]
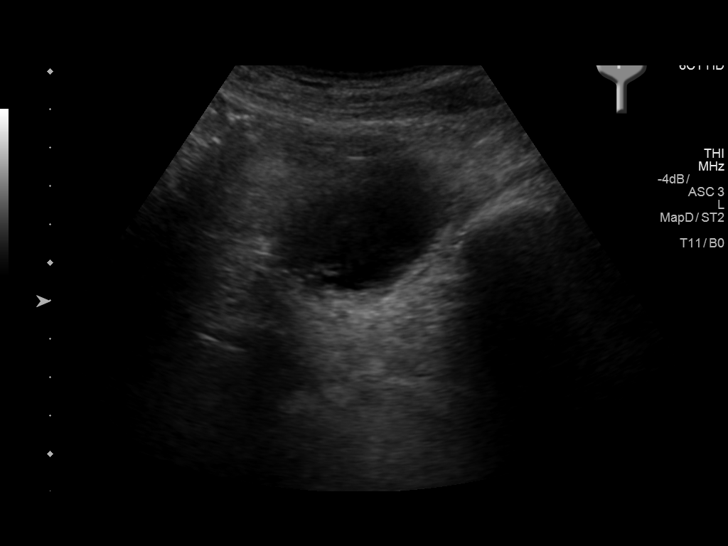
[im 25/30]
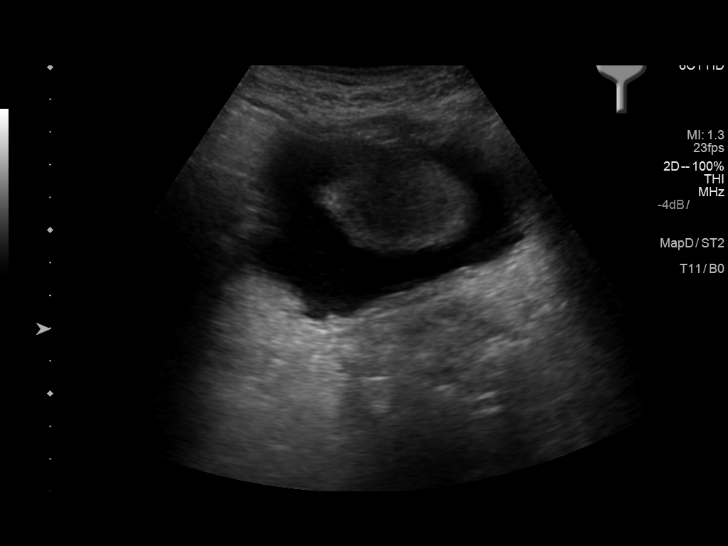
[im 27/30]
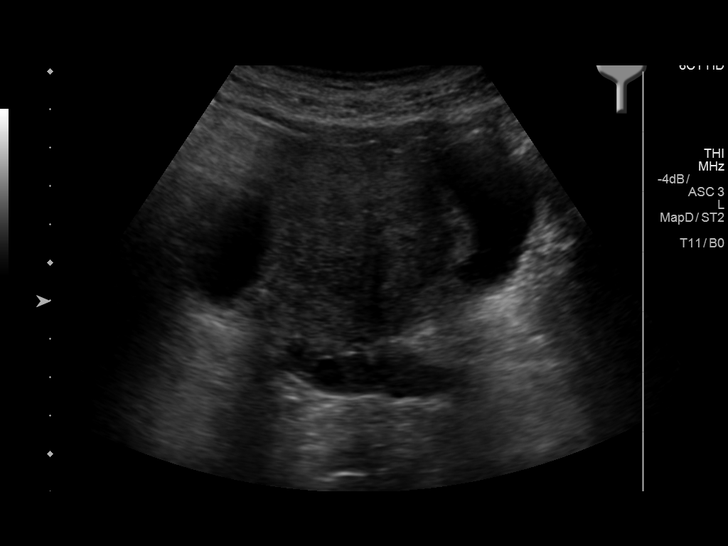
[im 30/30]
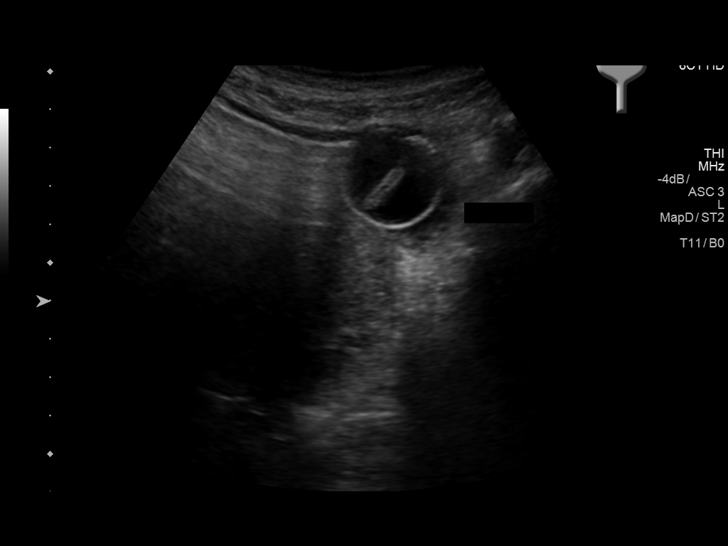

[13 of 25 positions shown; findings below may reference images not displayed]

FINDINGS: Right Kidney:

Length: 12.7 cm. Echogenic right kidney. No renal parenchymal
atrophy. No hydronephrosis. Minimally complex 6.4 x 3.7 x 4.2 cm
renal cyst in the upper right kidney with lobulated contour,
minimally thickened eccentric internal septation and no internal
vascularity, which measured 6.2 x 3.7 x 3.5 cm on 11/13/2014, not
definitely changed in size accounting for differences in modality.
There is a separate exophytic simple 2.0 cm renal cyst in the lower
right kidney.

Left Kidney:

Status post left nephrectomy. No mass or fluid collection
demonstrated in the left nephrectomy bed.

Bladder:

Urinary bladder is nearly collapsed by indwelling Foley catheter.
Re- demonstrated is a markedly enlarged prostate gland with
prominent mass effect on the bladder base.
IMPRESSION: 1. Minimally complex 6.4 cm renal cyst in the upper right kidney,
not appreciably changed in size since 11/13/2014 CT, suggesting a
benign renal cyst. Additional smaller simple exophytic renal cyst in
the lower right kidney. No overtly suspicious right renal mass.
2. Echogenic right kidney, indicating nonspecific right renal
parenchymal disease of uncertain chronicity. No right
hydronephrosis.
3. No abnormal findings in the left nephrectomy bed .
4. Re- demonstration of markedly enlarged prostate gland with
prominent mass effect on the bladder base. Bladder nearly collapsed
by indwelling Foley catheter.

## 2016-09-02 DIAGNOSIS — R010 Benign and innocent cardiac murmurs: Secondary | ICD-10-CM | POA: Diagnosis not present

## 2016-09-02 DIAGNOSIS — J449 Chronic obstructive pulmonary disease, unspecified: Secondary | ICD-10-CM | POA: Diagnosis not present

## 2016-09-02 DIAGNOSIS — I10 Essential (primary) hypertension: Secondary | ICD-10-CM | POA: Diagnosis not present

## 2016-09-02 DIAGNOSIS — I2510 Atherosclerotic heart disease of native coronary artery without angina pectoris: Secondary | ICD-10-CM | POA: Diagnosis not present

## 2016-09-02 DIAGNOSIS — Z125 Encounter for screening for malignant neoplasm of prostate: Secondary | ICD-10-CM | POA: Diagnosis not present

## 2016-09-02 DIAGNOSIS — I498 Other specified cardiac arrhythmias: Secondary | ICD-10-CM | POA: Diagnosis not present

## 2016-09-04 ENCOUNTER — Other Ambulatory Visit: Payer: Self-pay

## 2016-10-17 ENCOUNTER — Ambulatory Visit (INDEPENDENT_AMBULATORY_CARE_PROVIDER_SITE_OTHER): Payer: PPO | Admitting: Urology

## 2016-10-17 ENCOUNTER — Encounter: Payer: Self-pay | Admitting: Urology

## 2016-10-17 ENCOUNTER — Other Ambulatory Visit: Payer: Self-pay | Admitting: Radiology

## 2016-10-17 ENCOUNTER — Telehealth: Payer: Self-pay | Admitting: Radiology

## 2016-10-17 VITALS — BP 111/53 | HR 63 | Ht 68.0 in | Wt 148.5 lb

## 2016-10-17 DIAGNOSIS — C642 Malignant neoplasm of left kidney, except renal pelvis: Secondary | ICD-10-CM

## 2016-10-17 DIAGNOSIS — C672 Malignant neoplasm of lateral wall of bladder: Secondary | ICD-10-CM

## 2016-10-17 DIAGNOSIS — N400 Enlarged prostate without lower urinary tract symptoms: Secondary | ICD-10-CM

## 2016-10-17 DIAGNOSIS — R972 Elevated prostate specific antigen [PSA]: Secondary | ICD-10-CM

## 2016-10-17 LAB — URINALYSIS, COMPLETE
BILIRUBIN UA: NEGATIVE
GLUCOSE, UA: NEGATIVE
KETONES UA: NEGATIVE
NITRITE UA: NEGATIVE
SPEC GRAV UA: 1.025 (ref 1.005–1.030)
UUROB: 0.2 mg/dL (ref 0.2–1.0)
pH, UA: 5.5 (ref 5.0–7.5)

## 2016-10-17 LAB — MICROSCOPIC EXAMINATION

## 2016-10-17 MED ORDER — CIPROFLOXACIN HCL 500 MG PO TABS
500.0000 mg | ORAL_TABLET | Freq: Once | ORAL | Status: AC
  Administered 2016-10-17: 500 mg via ORAL

## 2016-10-17 MED ORDER — LIDOCAINE HCL 2 % EX GEL
1.0000 "application " | Freq: Once | CUTANEOUS | Status: AC
  Administered 2016-10-17: 1 via URETHRAL

## 2016-10-17 NOTE — Progress Notes (Signed)
9:05 AM  10/17/16  DEONTAE BOXBERGER 02-08-1935 [id number]  Referring provider: [name], MD [place], [place] [address]  Chief Complaint  Patient presents with  . 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  He returns today for cystoscopy.  History of upper tract TCC/ Bladder cancer He was  found to have left upper pole filling defect suspicious for upper tract TCC on CT Urogram 06/2014. Further workup of this lesion revealed a papillary upper pole lesion consistent with upper tract TCC. Given the inability to easily survey this lesion endoscopically, he underwent left hand-assisted laparoscopic nephroureterectomy on 11/20/2014.   Surgical pathology showed a low-grade superficial papillary ( inverted architecture) without evidence of invasion. Single lymph node was negative for malignancy. pTaN0Mx   Surveillance RUS wnl on 03/10/16 with stable complex cyst 6.4 cm.    Incidental 2 cm papillary bladder tumor posterior bladder wall identified at the time of holmium laser enucleation of the prostate on 04/07/2016.  Pathology consistent with low-grade, noninvasive papillary TCC. Lamina propria was present and uninvolved.  Last cysto 07/17/16 NED.   Elevated PSA He underwent prostate biopsy on 07/18/14 which was negative for malignancy in setting of elevated PSA to 17.7 (07/03/14), repeat 7.7 ng/dL in 02/10/15. TRUS vol 149 cc with large median lobe noted/    History of BPH/ urinary retention Developed urinary retention 03/01/16, failed VT x multiple on maximal medical management.  Unable to self cath due to prostate anatomy.  He was taken to the operating room on 04/07/2016 for holmium laser enucleation of the prostate, primarily treating his very large median lobe. Voiding well  since surgery.    Surgical pathology shows 79 g of prostate tissue, consistent with benign prostatic tissue with stromal and glandular hyperplasia along with chronic inflammation.   PMH: Past Medical History:  Diagnosis Date  . BPH (benign prostatic hyperplasia)   . COPD (chronic obstructive pulmonary disease) ([place])   . Elevated PSA   . Encounter for removal of urinary catheter   . GERD (gastroesophageal reflux disease)   . Gross hematuria   . Heart murmur   . Hyperlipidemia   . Hypertension   . Incomplete bladder emptying   . Kidney filling defect   . Paraphimosis   . Primary cancer of left renal pelvis ([place])   . Stroke ([place]) 2010  . Urinary tract infection     Surgical History: Past Surgical History:  Procedure Laterality Date  . BACK SURGERY     lower back. patient unsure if there is metal in back  . CATARACT EXTRACTION W/ INTRAOCULAR LENS  IMPLANT, BILATERAL Bilateral   . COCHLEAR IMPLANT Left    middle ear implant  . CYSTOSCOPY N/A 04/07/2016   Procedure: Fulgeration and evacuation of clot;  Surgeon: [name], MD;  Location: [facility] ORS;  Service: Urology;  Laterality: N/A;  . CYSTOSCOPY W/ RETROGRADES Right 04/07/2016   Procedure: CYSTOSCOPY WITH RETROGRADE PYELOGRAM;  Surgeon: [name], MD;  Location: [facility];  Service: Urology;  Laterality: Right;  . EAR MASTOIDECTOMY W/ COCHLEAR IMPLANT W/ LANDMARK    . ELBOW SURGERY Left    ball and joint replaced  . FOOT SURGERY Left 1980   lawn mower accident  . HOLEP-LASER ENUCLEATION OF THE PROSTATE WITH MORCELLATION N/A 04/07/2016  Procedure: HOLEP-LASER ENUCLEATION OF THE PROSTATE WITH MORCELLATION/ TURBT;  Surgeon: [name], MD;  Location: [facility];  Service: Urology;  Laterality: N/A;  . TOTAL KNEE ARTHROPLASTY Left   . TOTAL NEPHRECTOMY Left 11/2013    Home Medications:    Medication List       Accurate as of 10/17/16  9:05 AM. Always use your most recent med list.          amLODipine 5 MG  tablet Commonly known as:  NORVASC Take 5 mg by mouth at bedtime.   aspirin 81 MG tablet Take 81 mg by mouth daily.   EYE HEALTH Caps Take by mouth.   ferrous sulfate 324 (65 Fe) MG Tbec Take 1 tablet by mouth daily.   finasteride 5 MG tablet Commonly known as:  PROSCAR Take 1 tablet (5 mg total) by mouth daily.   Fluticasone-Salmeterol 250-50 MCG/DOSE Aepb Commonly known as:  ADVAIR Inhale 1 puff into the lungs 2 (two) times daily.   gabapentin 300 MG capsule Commonly known as:  NEURONTIN Take 300 mg by mouth at bedtime. Reported on 03/06/2016   ipratropium-albuterol 0.5-2.5 (3) MG/3ML Soln Commonly known as:  DUONEB Take 3 mLs by nebulization every 4 (four) hours as needed.   lisinopril 10 MG tablet Commonly known as:  PRINIVIL,ZESTRIL Take 10 mg by mouth daily.   metoprolol tartrate 25 MG tablet Commonly known as:  LOPRESSOR Take 25 mg by mouth 2 (two) times daily.   pravastatin 40 MG tablet Commonly known as:  PRAVACHOL Take 40 mg by mouth at bedtime.   PROAIR HFA 108 (90 Base) MCG/ACT inhaler Generic drug:  albuterol   ranitidine 75 MG tablet Commonly known as:  ZANTAC Take 75 mg by mouth 2 (two) times daily as needed. Reported on 03/06/2016   rOPINIRole 1 MG tablet Commonly known as:  REQUIP   triamcinolone cream 0.1 % Commonly known as:  KENALOG Apply 1 application topically 2 (two) times daily.       Allergies: No Known Allergies  Family History: Family History  Problem Relation Age of Onset  . Brain cancer Father   . Lung cancer Father   . Kidney disease Neg Hx   . Prostate cancer Neg Hx     Social History:  reports that he has been smoking Pipe.  He has never used smokeless tobacco. He reports that he does not drink alcohol or use drugs.   Physical Exam: BP (!) 111/53   Pulse 63   Ht 5\' 8"  (1.727 m)   Wt 148 lb 8 oz (67.4 kg)   BMI 22.58 kg/m   Constitutional:  Alert and oriented, No acute distress.  Accompanied to the office  today by his wife. HEENT: [place] AT, moist mucus membranes.  Trachea midline, no masses. Cardiovascular: No clubbing, cyanosis, or edema. RRR. Respiratory: Normal respiratory effort, no increased work of breathing.  CTAB. GI: Abdomen is soft, nontender, nondistended, no abdominal masses [id number] phallus with orthotopic meatus.   Skin: No rashes, bruises or suspicious lesions. Neurologic: Grossly intact, no focal deficits, moving all 4 extremities. Psychiatric: Normal mood and affect.  Laboratory Data: Lab Results  Component Value Date   WBC 8.9 04/08/2016   HGB 9.6 (L) 04/08/2016   HCT 28.5 (L) 04/08/2016   MCV 89.8 04/08/2016   PLT 113 (L) 04/08/2016    Lab Results  Component Value Date   CREATININE 1.19 04/08/2016    Cystoscopy Procedure Note  Patient identification was confirmed, informed consent was obtained,  and patient was prepped using Betadine solution.  Lidocaine jelly was administered per urethral meatus.    Preoperative abx where received prior to procedure.     Pre-Procedure: - Inspection reveals a normal caliber ureteral meatus.  Procedure: The flexible cystoscope was introduced without difficulty - No urethral strictures/lesions are present. - Enlarged prostate with small posterior false pass just proximal to veru, bilobar coaptation - Normal bladder neck, no longer elevated - Bilateral ureteral orifices identified - Bladder mucosa Stellate scar on right lateral wall with some fine papillary growth on posterior edge concerning for recurrence. He also has some suspicious papillary carpeting on the left lateral wall. - No bladder stones - Moderate trabeculation  Retroflexion shows interval resolution of large median lobe.  Small protrusion of bilateral lateral lobe just above bladder neck.   Post-Procedure: - Patient tolerated the procedure well   Assessment & Plan:   1. Urothelial carcinoma of kidney, left/ History of bladder cancer S/p L laparoscopic  nephroureterectomy 11/2014. Surgical pathology showed a low-grade superficial papillary ( inverted architecture) without evidence of invasion. Single lymph node was negative for malignancy. pTaN0Mx.  Bladder recurrence with 2 cm tumor, LgTa TCC 04/07/16  Cystoscopy today with fine carpeting, suspicious for recurrence of low-grade noninvasive TCC. I discussed his options including in office fulguration versus proceeding to the operating room for TURBT, fulguration. He prefer the operating room at this time. We will also go ahead and repeat upper tract imaging in the form of retrograde pyelogram and plan for installation of mitomycin. He is agreeable with this plan. Risk and benefits were reviewed.  Preop urine culture.  2. Benign prostatic hypertrophy (BPH) with incomplete bladder emptying/ urinary retention S/p HoLEP of median lobe Symptomatically improved- finasteride/ flomax held at this time  3. Elevated PSA S/p prostate biopsy on 07/18/14 which was negative for malignancy in setting of elevated PSA to 17.7 (07/03/14), repeat 7.7 ng/dL in 02/10/15.  Will readdress at follow up visit- given his age, PSA screening is no longer recommended  Schedule surgery as above   [name], Tina [address] [place], [place] [address] [phone]

## 2016-10-17 NOTE — Telephone Encounter (Signed)
Notified pt's wife of surgery scheduled with Dr Erlene Quan on 11/04/16, pre-admit testing appt on 10/23/16 @9 :68 & to call day prior to surgery for arrival time to SDS. Also notified of surgical clearance appt with Dr Neoma Laming on 10/20/16 @2 :30. Wife voices understanding.

## 2016-10-20 DIAGNOSIS — F17200 Nicotine dependence, unspecified, uncomplicated: Secondary | ICD-10-CM | POA: Diagnosis not present

## 2016-10-20 DIAGNOSIS — J449 Chronic obstructive pulmonary disease, unspecified: Secondary | ICD-10-CM | POA: Diagnosis not present

## 2016-10-20 DIAGNOSIS — R0602 Shortness of breath: Secondary | ICD-10-CM | POA: Diagnosis not present

## 2016-10-20 DIAGNOSIS — I10 Essential (primary) hypertension: Secondary | ICD-10-CM | POA: Diagnosis not present

## 2016-10-20 DIAGNOSIS — I2510 Atherosclerotic heart disease of native coronary artery without angina pectoris: Secondary | ICD-10-CM | POA: Diagnosis not present

## 2016-10-20 DIAGNOSIS — E782 Mixed hyperlipidemia: Secondary | ICD-10-CM | POA: Diagnosis not present

## 2016-10-20 DIAGNOSIS — Z716 Tobacco abuse counseling: Secondary | ICD-10-CM | POA: Diagnosis not present

## 2016-10-20 LAB — CULTURE, URINE COMPREHENSIVE

## 2016-10-21 DIAGNOSIS — R0602 Shortness of breath: Secondary | ICD-10-CM | POA: Diagnosis not present

## 2016-10-23 ENCOUNTER — Encounter
Admission: RE | Admit: 2016-10-23 | Discharge: 2016-10-23 | Disposition: A | Discharge status: Home / Self Care | Payer: PPO | Source: Ambulatory Visit | Attending: Urology | Admitting: Urology

## 2016-10-23 DIAGNOSIS — Z01818 Encounter for other preprocedural examination: Secondary | ICD-10-CM | POA: Diagnosis not present

## 2016-10-23 LAB — CBC
HCT: 43.1 % (ref 40.0–52.0)
Hemoglobin: 14.6 g/dL (ref 13.0–18.0)
MCH: 30.1 pg (ref 26.0–34.0)
MCHC: 33.8 g/dL (ref 32.0–36.0)
MCV: 89 fL (ref 80.0–100.0)
PLATELETS: 184 10*3/uL (ref 150–440)
RBC: 4.85 MIL/uL (ref 4.40–5.90)
RDW: 14.5 % (ref 11.5–14.5)
WBC: 10.4 10*3/uL (ref 3.8–10.6)

## 2016-10-23 NOTE — Patient Instructions (Signed)
Your procedure is scheduled on: Tuesday 11/04/16 Report to Day Surgery. 2ND FLOOR MEDICAL MALL ENTRANCE To find out your arrival time please call [phone] between 1PM - 3PM on Monday 11/03/16.  Remember: Instructions that are not followed completely may result in serious medical risk, up to and including death, or upon the discretion of your surgeon and anesthesiologist your surgery may need to be rescheduled.    __X__ 1. Do not eat food or drink liquids after midnight. No gum chewing or hard candies.     __X__ 2. No Alcohol for 24 hours before or after surgery.   ____ 3. Bring all medications with you on the day of surgery if instructed.    __X__ 4. Notify your doctor if there is any change in your medical condition     (cold, fever, infections).     Do not wear jewelry, make-up, hairpins, clips or nail polish.  Do not wear lotions, powders, or perfumes.   Do not shave 48 hours prior to surgery. Men may shave face and neck.  Do not bring valuables to the hospital.    [facility] is not responsible for any belongings or valuables.               Contacts, dentures or bridgework may not be worn into surgery.  Leave your suitcase in the car. After surgery it may be brought to your room.  For patients admitted to the hospital, discharge time is determined by your                treatment team.   Patients discharged the day of surgery will not be allowed to drive home.   Please read over the following fact sheets that you were given:   Pain Booklet   __X__ Take these medicines the morning of surgery with A SIP OF WATER:    1. CARVEDILOL  2. LISINOPRIL  3. RANITIDINE  4.  5.  6.  ____ Fleet Enema (as directed)   ____ Use CHG Soap as directed  __X__ Use inhalers on the day of surgery AND NEBULIZER THE DAY OF SURGERY  ____ Stop metformin 2 days prior to surgery    ____ Take 1/2 of usual insulin dose the night before surgery and none on the morning of surgery.   __X__  Stop Coumadin/Plavix/aspirin on AS INSTRUCTED BY YOUR CARDIOLOGIST  ____ Stop Anti-inflammatories on    ____ Stop supplements until after surgery.    ____ Bring C-Pap to the hospital.

## 2016-11-03 MED ORDER — CEFAZOLIN IN D5W 1 GM/50ML IV SOLN
1.0000 g | INTRAVENOUS | Status: AC
  Administered 2016-11-04: 1 g via INTRAVENOUS

## 2016-11-04 ENCOUNTER — Encounter: Payer: Self-pay | Admitting: *Deleted

## 2016-11-04 ENCOUNTER — Ambulatory Visit: Payer: PPO | Admitting: Anesthesiology

## 2016-11-04 ENCOUNTER — Ambulatory Visit
Admission: RE | Admit: 2016-11-04 | Discharge: 2016-11-04 | Disposition: A | Discharge status: Home / Self Care | Payer: PPO | Source: Ambulatory Visit | Attending: Urology | Admitting: Urology

## 2016-11-04 ENCOUNTER — Encounter
Admission: RE | Disposition: A | Discharge status: Home / Self Care | Payer: Self-pay | Source: Ambulatory Visit | Attending: Urology

## 2016-11-04 DIAGNOSIS — Z905 Acquired absence of kidney: Secondary | ICD-10-CM | POA: Diagnosis not present

## 2016-11-04 DIAGNOSIS — Z96652 Presence of left artificial knee joint: Secondary | ICD-10-CM | POA: Diagnosis not present

## 2016-11-04 DIAGNOSIS — Z8673 Personal history of transient ischemic attack (TIA), and cerebral infarction without residual deficits: Secondary | ICD-10-CM | POA: Insufficient documentation

## 2016-11-04 DIAGNOSIS — Z7982 Long term (current) use of aspirin: Secondary | ICD-10-CM | POA: Diagnosis not present

## 2016-11-04 DIAGNOSIS — F17290 Nicotine dependence, other tobacco product, uncomplicated: Secondary | ICD-10-CM | POA: Diagnosis not present

## 2016-11-04 DIAGNOSIS — Z79899 Other long term (current) drug therapy: Secondary | ICD-10-CM | POA: Insufficient documentation

## 2016-11-04 DIAGNOSIS — N3020 Other chronic cystitis without hematuria: Secondary | ICD-10-CM | POA: Diagnosis not present

## 2016-11-04 DIAGNOSIS — Z8553 Personal history of malignant neoplasm of renal pelvis: Secondary | ICD-10-CM | POA: Insufficient documentation

## 2016-11-04 DIAGNOSIS — Z8551 Personal history of malignant neoplasm of bladder: Secondary | ICD-10-CM | POA: Insufficient documentation

## 2016-11-04 DIAGNOSIS — I10 Essential (primary) hypertension: Secondary | ICD-10-CM | POA: Diagnosis not present

## 2016-11-04 DIAGNOSIS — J449 Chronic obstructive pulmonary disease, unspecified: Secondary | ICD-10-CM | POA: Insufficient documentation

## 2016-11-04 DIAGNOSIS — C679 Malignant neoplasm of bladder, unspecified: Secondary | ICD-10-CM | POA: Diagnosis not present

## 2016-11-04 DIAGNOSIS — C672 Malignant neoplasm of lateral wall of bladder: Secondary | ICD-10-CM | POA: Diagnosis not present

## 2016-11-04 DIAGNOSIS — E785 Hyperlipidemia, unspecified: Secondary | ICD-10-CM | POA: Diagnosis not present

## 2016-11-04 DIAGNOSIS — K219 Gastro-esophageal reflux disease without esophagitis: Secondary | ICD-10-CM | POA: Diagnosis not present

## 2016-11-04 HISTORY — PX: TRANSURETHRAL RESECTION OF BLADDER TUMOR WITH MITOMYCIN-C: SHX6459

## 2016-11-04 HISTORY — PX: URETEROSCOPY: SHX842

## 2016-11-04 HISTORY — PX: CYSTOSCOPY W/ URETERAL STENT PLACEMENT: SHX1429

## 2016-11-04 SURGERY — TRANSURETHRAL RESECTION OF BLADDER TUMOR WITH MITOMYCIN-C
Anesthesia: General | Site: Bladder | Laterality: Right | Wound class: Clean Contaminated

## 2016-11-04 MED ORDER — MITOMYCIN CHEMO FOR BLADDER INSTILLATION 40 MG
40.0000 mg | Freq: Once | INTRAVENOUS | Status: DC
  Filled 2016-11-04: qty 40

## 2016-11-04 MED ORDER — CEFAZOLIN IN D5W 1 GM/50ML IV SOLN
INTRAVENOUS | Status: AC
  Filled 2016-11-04: qty 50

## 2016-11-04 MED ORDER — PROMETHAZINE HCL 25 MG/ML IJ SOLN: 6.2500 mg | INTRAMUSCULAR | Status: DC | PRN

## 2016-11-04 MED ORDER — FENTANYL CITRATE (PF) 100 MCG/2ML IJ SOLN: 25.0000 ug | INTRAMUSCULAR | Status: DC | PRN

## 2016-11-04 MED ORDER — LACTATED RINGERS IV SOLN
INTRAVENOUS | Status: DC
  Administered 2016-11-04: 09:00:00 via INTRAVENOUS

## 2016-11-04 MED ORDER — PROPOFOL 10 MG/ML IV BOLUS
INTRAVENOUS | Status: DC | PRN
  Administered 2016-11-04: 100 mg via INTRAVENOUS

## 2016-11-04 MED ORDER — HYDROCODONE-ACETAMINOPHEN 5-325 MG PO TABS: 1.0000 | ORAL_TABLET | Freq: Four times a day (QID) | ORAL | 0 refills | Status: DC | PRN

## 2016-11-04 MED ORDER — ONDANSETRON HCL 4 MG/2ML IJ SOLN
INTRAMUSCULAR | Status: DC | PRN
  Administered 2016-11-04: 4 mg via INTRAVENOUS

## 2016-11-04 MED ORDER — LIDOCAINE HCL (CARDIAC) 20 MG/ML IV SOLN
INTRAVENOUS | Status: DC | PRN
  Administered 2016-11-04: 20 mg via INTRAVENOUS

## 2016-11-04 MED ORDER — FENTANYL CITRATE (PF) 100 MCG/2ML IJ SOLN
INTRAMUSCULAR | Status: DC | PRN
Start: 2016-11-04 — End: 2016-11-04
  Administered 2016-11-04: 25 ug via INTRAVENOUS
  Administered 2016-11-04: 50 ug via INTRAVENOUS
  Administered 2016-11-04: 25 ug via INTRAVENOUS

## 2016-11-04 MED ORDER — IOTHALAMATE MEGLUMINE 43 % IV SOLN
INTRAVENOUS | Status: DC | PRN
  Administered 2016-11-04: 15 mL

## 2016-11-04 MED ORDER — EPHEDRINE SULFATE 50 MG/ML IJ SOLN
INTRAMUSCULAR | Status: DC | PRN
  Administered 2016-11-04: 10 mg via INTRAVENOUS
  Administered 2016-11-04 (×2): 5 mg via INTRAVENOUS

## 2016-11-04 MED ORDER — MEPERIDINE HCL 25 MG/ML IJ SOLN: 6.2500 mg | INTRAMUSCULAR | Status: DC | PRN

## 2016-11-04 MED ORDER — OXYCODONE HCL 5 MG PO TABS: 5.0000 mg | ORAL_TABLET | Freq: Once | ORAL | Status: DC | PRN

## 2016-11-04 MED ORDER — MITOMYCIN CHEMO FOR BLADDER INSTILLATION 40 MG
INTRAVENOUS | Status: DC | PRN
  Administered 2016-11-04: 40 mg via INTRAVESICAL

## 2016-11-04 MED ORDER — OXYCODONE HCL 5 MG/5ML PO SOLN
5.0000 mg | Freq: Once | ORAL | Status: DC | PRN
Start: 2016-11-04 — End: 2016-11-04

## 2016-11-04 MED ORDER — DEXAMETHASONE SODIUM PHOSPHATE 10 MG/ML IJ SOLN
INTRAMUSCULAR | Status: DC | PRN
  Administered 2016-11-04: 4 mg via INTRAVENOUS

## 2016-11-04 SURGICAL SUPPLY — 34 items
BAG DRAIN CYSTO-URO LG1000N (MISCELLANEOUS) ×4 IMPLANT
BAG URO DRAIN 2000ML W/SPOUT (MISCELLANEOUS) ×4 IMPLANT
CATH FOL 2WAY LX 18X30 (CATHETERS) ×4 IMPLANT
CATH FOLEY 2WAY  5CC 16FR (CATHETERS)
CATH URETL 5X70 OPEN END (CATHETERS) ×4 IMPLANT
CATH URTH 16FR FL 2W BLN LF (CATHETERS) IMPLANT
CONRAY 43 FOR UROLOGY 50M (MISCELLANEOUS) ×4 IMPLANT
DRAPE UTILITY 15X26 TOWEL STRL (DRAPES) ×4 IMPLANT
DRESSING TELFA 4X3 1S ST N-ADH (GAUZE/BANDAGES/DRESSINGS) ×4 IMPLANT
ELECT LOOP 22F BIPOLAR SML (ELECTROSURGICAL) ×4
ELECT REM PT RETURN 9FT ADLT (ELECTROSURGICAL)
ELECTRODE LOOP 22F BIPOLAR SML (ELECTROSURGICAL) ×3 IMPLANT
ELECTRODE REM PT RTRN 9FT ADLT (ELECTROSURGICAL) IMPLANT
GLOVE BIO SURGEON STRL SZ 6.5 (GLOVE) ×8 IMPLANT
GOWN STRL REUS W/ TWL LRG LVL3 (GOWN DISPOSABLE) ×6 IMPLANT
GOWN STRL REUS W/TWL LRG LVL3 (GOWN DISPOSABLE) ×2
GUIDEWIRE SUPER STIFF (WIRE) ×4 IMPLANT
KIT RM TURNOVER CYSTO AR (KITS) ×4 IMPLANT
LOOP CUT BIPOLAR 24F LRG (ELECTROSURGICAL) IMPLANT
NDL SAFETY ECLIPSE 18X1.5 (NEEDLE) ×3 IMPLANT
NEEDLE HYPO 18GX1.5 SHARP (NEEDLE) ×1
PACK CYSTO AR (MISCELLANEOUS) ×4 IMPLANT
SCRUB POVIDONE IODINE 4 OZ (MISCELLANEOUS) ×4 IMPLANT
SENSORWIRE 0.038 NOT ANGLED (WIRE) ×4
SET CYSTO W/LG BORE CLAMP LF (SET/KITS/TRAYS/PACK) IMPLANT
SET IRRIG Y TYPE TUR BLADDER L (SET/KITS/TRAYS/PACK) ×4 IMPLANT
SET IRRIGATING DISP (SET/KITS/TRAYS/PACK) ×4 IMPLANT
SOL .9 NS 3000ML IRR  AL (IV SOLUTION) ×4
SOL .9 NS 3000ML IRR UROMATIC (IV SOLUTION) ×12 IMPLANT
STENT URET 6FRX26 CONTOUR (STENTS) ×4 IMPLANT
SURGILUBE 2OZ TUBE FLIPTOP (MISCELLANEOUS) ×4 IMPLANT
SYRINGE IRR TOOMEY STRL 70CC (SYRINGE) ×4 IMPLANT
WATER STERILE IRR 1000ML POUR (IV SOLUTION) ×4 IMPLANT
WIRE SENSOR 0.038 NOT ANGLED (WIRE) ×3 IMPLANT

## 2016-11-04 NOTE — Anesthesia Preprocedure Evaluation (Signed)
Anesthesia Evaluation  Patient identified by MRN, date of birth, ID band Patient awake    Reviewed: Allergy & Precautions, NPO status , Patient's Chart, lab work & pertinent test results  History of Anesthesia Complications Negative for: history of anesthetic complications  Airway Mallampati: II  TM Distance: >3 FB Neck ROM: Full    Dental  (+) Upper Dentures, Poor Dentition, Missing   Pulmonary neg sleep apnea, COPD,  COPD inhaler, Current Smoker,    breath sounds clear to auscultation- rhonchi (-) wheezing      Cardiovascular hypertension, Pt. on medications (-) CAD, (-) Past MI and (-) Cardiac Stents  Rhythm:Regular Rate:Normal - Systolic murmurs and - Diastolic murmurs    Neuro/Psych CVA, No Residual Symptoms negative psych ROS   GI/Hepatic Neg liver ROS, GERD  ,  Endo/Other  negative endocrine ROSneg diabetes  Renal/GU CRFRenal disease     Musculoskeletal negative musculoskeletal ROS (+)   Abdominal (+) - obese,   Peds  Hematology negative hematology ROS (+)   Anesthesia Other Findings Past Medical History: No date: BPH (benign prostatic hyperplasia) No date: COPD (chronic obstructive pulmonary disease) (* No date: Elevated PSA No date: Encounter for removal of urinary catheter No date: GERD (gastroesophageal reflux disease) No date: Gross hematuria No date: Heart murmur No date: Hyperlipidemia No date: Hypertension No date: Incomplete bladder emptying No date: Kidney filling defect No date: Paraphimosis No date: Primary cancer of left renal pelvis ([place]) 2010: Stroke ([place]) No date: Urinary tract infection   Reproductive/Obstetrics                             Anesthesia Physical Anesthesia Plan  ASA: III  Anesthesia Plan: General   Post-op Pain Management:    Induction: Intravenous  Airway Management Planned: LMA  Additional Equipment:   Intra-op Plan:    Post-operative Plan:   Informed Consent: I have reviewed the patients History and Physical, chart, labs and discussed the procedure including the risks, benefits and alternatives for the proposed anesthesia with the patient or authorized representative who has indicated his/her understanding and acceptance.   Dental advisory given  Plan Discussed with: CRNA and Anesthesiologist  Anesthesia Plan Comments:         Anesthesia Quick Evaluation

## 2016-11-04 NOTE — H&P (View-Only) (Signed)
9:05 AM  10/17/16  Eric Mullins 1935/07/01 [id number]  Referring provider: [name], MD [place], [place] [address]  Chief Complaint  Patient presents with  . 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  He returns today for cystoscopy.  History of upper tract TCC/ Bladder cancer He was  found to have left upper pole filling defect suspicious for upper tract TCC on CT Urogram 06/2014. Further workup of this lesion revealed a papillary upper pole lesion consistent with upper tract TCC. Given the inability to easily survey this lesion endoscopically, he underwent left hand-assisted laparoscopic nephroureterectomy on 11/20/2014.   Surgical pathology showed a low-grade superficial papillary ( inverted architecture) without evidence of invasion. Single lymph node was negative for malignancy. pTaN0Mx   Surveillance RUS wnl on 03/10/16 with stable complex cyst 6.4 cm.    Incidental 2 cm papillary bladder tumor posterior bladder wall identified at the time of holmium laser enucleation of the prostate on 04/07/2016.  Pathology consistent with low-grade, noninvasive papillary TCC. Lamina propria was present and uninvolved.  Last cysto 07/17/16 NED.   Elevated PSA He underwent prostate biopsy on 07/18/14 which was negative for malignancy in setting of elevated PSA to 17.7 (07/03/14), repeat 7.7 ng/dL in 02/10/15. TRUS vol 149 cc with large median lobe noted/    History of BPH/ urinary retention Developed urinary retention 03/01/16, failed VT x multiple on maximal medical management.  Unable to self cath due to prostate anatomy.  He was taken to the operating room on 04/07/2016 for holmium laser enucleation of the prostate, primarily treating his very large median lobe. Voiding well  since surgery.    Surgical pathology shows 79 g of prostate tissue, consistent with benign prostatic tissue with stromal and glandular hyperplasia along with chronic inflammation.   PMH: Past Medical History:  Diagnosis Date  . BPH (benign prostatic hyperplasia)   . COPD (chronic obstructive pulmonary disease) ([place])   . Elevated PSA   . Encounter for removal of urinary catheter   . GERD (gastroesophageal reflux disease)   . Gross hematuria   . Heart murmur   . Hyperlipidemia   . Hypertension   . Incomplete bladder emptying   . Kidney filling defect   . Paraphimosis   . Primary cancer of left renal pelvis ([place])   . Stroke ([place]) 2010  . Urinary tract infection     Surgical History: Past Surgical History:  Procedure Laterality Date  . BACK SURGERY     lower back. patient unsure if there is metal in back  . CATARACT EXTRACTION W/ INTRAOCULAR LENS  IMPLANT, BILATERAL Bilateral   . COCHLEAR IMPLANT Left    middle ear implant  . CYSTOSCOPY N/A 04/07/2016   Procedure: Fulgeration and evacuation of clot;  Surgeon: [name], MD;  Location: [facility] ORS;  Service: Urology;  Laterality: N/A;  . CYSTOSCOPY W/ RETROGRADES Right 04/07/2016   Procedure: CYSTOSCOPY WITH RETROGRADE PYELOGRAM;  Surgeon: [name], MD;  Location: [facility] ORS;  Service: Urology;  Laterality: Right;  . EAR MASTOIDECTOMY W/ COCHLEAR IMPLANT W/ LANDMARK    . ELBOW SURGERY Left    ball and joint replaced  . FOOT SURGERY Left 1980   lawn mower accident  . HOLEP-LASER ENUCLEATION OF THE PROSTATE WITH MORCELLATION N/A 04/07/2016  Procedure: HOLEP-LASER ENUCLEATION OF THE PROSTATE WITH MORCELLATION/ TURBT;  Surgeon: [name], MD;  Location: [facility] ORS;  Service: Urology;  Laterality: N/A;  . TOTAL KNEE ARTHROPLASTY Left   . TOTAL NEPHRECTOMY Left 11/2013    Home Medications:    Medication List       Accurate as of 10/17/16  9:05 AM. Always use your most recent med list.          amLODipine 5 MG  tablet Commonly known as:  NORVASC Take 5 mg by mouth at bedtime.   aspirin 81 MG tablet Take 81 mg by mouth daily.   EYE HEALTH Caps Take by mouth.   ferrous sulfate 324 (65 Fe) MG Tbec Take 1 tablet by mouth daily.   finasteride 5 MG tablet Commonly known as:  PROSCAR Take 1 tablet (5 mg total) by mouth daily.   Fluticasone-Salmeterol 250-50 MCG/DOSE Aepb Commonly known as:  ADVAIR Inhale 1 puff into the lungs 2 (two) times daily.   gabapentin 300 MG capsule Commonly known as:  NEURONTIN Take 300 mg by mouth at bedtime. Reported on 03/06/2016   ipratropium-albuterol 0.5-2.5 (3) MG/3ML Soln Commonly known as:  DUONEB Take 3 mLs by nebulization every 4 (four) hours as needed.   lisinopril 10 MG tablet Commonly known as:  PRINIVIL,ZESTRIL Take 10 mg by mouth daily.   metoprolol tartrate 25 MG tablet Commonly known as:  LOPRESSOR Take 25 mg by mouth 2 (two) times daily.   pravastatin 40 MG tablet Commonly known as:  PRAVACHOL Take 40 mg by mouth at bedtime.   PROAIR HFA 108 (90 Base) MCG/ACT inhaler Generic drug:  albuterol   ranitidine 75 MG tablet Commonly known as:  ZANTAC Take 75 mg by mouth 2 (two) times daily as needed. Reported on 03/06/2016   rOPINIRole 1 MG tablet Commonly known as:  REQUIP   triamcinolone cream 0.1 % Commonly known as:  KENALOG Apply 1 application topically 2 (two) times daily.       Allergies: No Known Allergies  Family History: Family History  Problem Relation Age of Onset  . Brain cancer Father   . Lung cancer Father   . Kidney disease Neg Hx   . Prostate cancer Neg Hx     Social History:  reports that he has been smoking Pipe.  He has never used smokeless tobacco. He reports that he does not drink alcohol or use drugs.   Physical Exam: BP (!) 111/53   Pulse 63   Ht 5\' 8"  (1.727 m)   Wt 148 lb 8 oz (67.4 kg)   BMI 22.58 kg/m   Constitutional:  Alert and oriented, No acute distress.  Accompanied to the office  today by his wife. HEENT: [place] AT, moist mucus membranes.  Trachea midline, no masses. Cardiovascular: No clubbing, cyanosis, or edema. RRR. Respiratory: Normal respiratory effort, no increased work of breathing.  CTAB. GI: Abdomen is soft, nontender, nondistended, no abdominal masses [id number] phallus with orthotopic meatus.   Skin: No rashes, bruises or suspicious lesions. Neurologic: Grossly intact, no focal deficits, moving all 4 extremities. Psychiatric: Normal mood and affect.  Laboratory Data: Lab Results  Component Value Date   WBC 8.9 04/08/2016   HGB 9.6 (L) 04/08/2016   HCT 28.5 (L) 04/08/2016   MCV 89.8 04/08/2016   PLT 113 (L) 04/08/2016    Lab Results  Component Value Date   CREATININE 1.19 04/08/2016    Cystoscopy Procedure Note  Patient identification was confirmed, informed consent was obtained,  and patient was prepped using Betadine solution.  Lidocaine jelly was administered per urethral meatus.    Preoperative abx where received prior to procedure.     Pre-Procedure: - Inspection reveals a normal caliber ureteral meatus.  Procedure: The flexible cystoscope was introduced without difficulty - No urethral strictures/lesions are present. - Enlarged prostate with small posterior false pass just proximal to veru, bilobar coaptation - Normal bladder neck, no longer elevated - Bilateral ureteral orifices identified - Bladder mucosa Stellate scar on right lateral wall with some fine papillary growth on posterior edge concerning for recurrence. He also has some suspicious papillary carpeting on the left lateral wall. - No bladder stones - Moderate trabeculation  Retroflexion shows interval resolution of large median lobe.  Small protrusion of bilateral lateral lobe just above bladder neck.   Post-Procedure: - Patient tolerated the procedure well   Assessment & Plan:   1. Urothelial carcinoma of kidney, left/ History of bladder cancer S/p L laparoscopic  nephroureterectomy 11/2014. Surgical pathology showed a low-grade superficial papillary ( inverted architecture) without evidence of invasion. Single lymph node was negative for malignancy. pTaN0Mx.  Bladder recurrence with 2 cm tumor, LgTa TCC 04/07/16  Cystoscopy today with fine carpeting, suspicious for recurrence of low-grade noninvasive TCC. I discussed his options including in office fulguration versus proceeding to the operating room for TURBT, fulguration. He prefer the operating room at this time. We will also go ahead and repeat upper tract imaging in the form of retrograde pyelogram and plan for installation of mitomycin. He is agreeable with this plan. Risk and benefits were reviewed.  Preop urine culture.  2. Benign prostatic hypertrophy (BPH) with incomplete bladder emptying/ urinary retention S/p HoLEP of median lobe Symptomatically improved- finasteride/ flomax held at this time  3. Elevated PSA S/p prostate biopsy on 07/18/14 which was negative for malignancy in setting of elevated PSA to 17.7 (07/03/14), repeat 7.7 ng/dL in 02/10/15.  Will readdress at follow up visit- given his age, PSA screening is no longer recommended  Schedule surgery as above   [name], [place] [address] [place], [place] [address] [phone]

## 2016-11-04 NOTE — Transfer of Care (Signed)
Immediate Anesthesia Transfer of Care Note  Patient: Eric Mullins  Procedure(s) Performed: Procedure(s): TRANSURETHRAL RESECTION OF BLADDER TUMOR WITH MITOMYCIN-C (N/A) URETEROSCOPY (Right) CYSTOSCOPY WITH RETROGRADE PYELOGRAM/URETERAL STENT PLACEMENT (Right)  Patient Location: PACU  Anesthesia Type:General  Level of Consciousness: awake, alert  and oriented  Airway & Oxygen Therapy: Patient Spontanous Breathing and Patient connected to face mask oxygen  Post-op Assessment: Report given to RN, Post -op Vital signs reviewed and stable and Patient moving all extremities X 4  Post vital signs: Reviewed and stable  Last Vitals:  Vitals:   11/04/16 0740 11/04/16 1102  BP: (!) 184/43   Pulse: 69   Resp: 16   Temp: 36.5 C (!) 35.8 C    Last Pain:  Vitals:   11/04/16 1102  TempSrc: Temporal         Complications: No apparent anesthesia complications

## 2016-11-04 NOTE — Discharge Instructions (Signed)
Bladder Cancer Bladder cancer is an abnormal growth of tissue in your bladder. Your bladder is the balloon-like sac in your pelvis. It collects and stores urine that comes from the kidneys through the ureters. The bladder wall is made of layers. If cancer spreads into these layers and through the wall of the bladder, it becomes more difficult to treat.  There are four stages of bladder cancer:  Stage I. Cancer at this stage occurs in the bladder's inner lining but has not invaded the muscular bladder wall.  Stage II. At this stage, cancer has invaded the bladder wall but is still confined to the bladder.  Stage III. By this stage, the cancer cells have spread through the bladder wall to surrounding tissue. They may also have spread to the prostate in men or the uterus or vagina in women.  Stage IV. By this stage, cancer cells may have spread to the lymph nodes and other organs, such as your lungs, bones, or liver. RISK FACTORS Although the cause of bladder cancer is not known, the following risk factors can increase your chances of getting bladder cancer:   Smoking.   Occupational exposures, such as rubber, leather, textile, dyes, chemicals, and paint.  Being white.  Age.   Being male.   Having chronic bladder inflammation.   Having a bladder cancer history.   Having a family history of bladder cancer (heredity).   Having had chemotherapy or radiation therapy to the pelvis.   Being exposed to arsenic.  SYMPTOMS   Blood in the urine.   Pain with urination.   Frequent bladder or urine infections.  Increase in urgency and frequency of urination. DIAGNOSIS  Your health care provider may suspect bladder cancer based on your description of urinary symptoms or based on the finding of blood or infection in the urine (especially if this has recurred several times). Other tests or procedures that may be performed include:   A narrow tube being inserted into your  bladder through your urethra (cystoscopy) in order to view the lining of your bladder for tumors.   A biopsy to sample the tumor to see if cancer is present.  If cancer is present, it will then be staged to determine its severity and extent. It is important to know how deeply into the bladder wall the cancer has grown and whether the cancer has spread to any other parts of your body. Staging may require blood tests or special scans such as a CT scan, MRI, bone scan, or chest X-ray.  TREATMENT  Once your cancer has been diagnosed and staged, you should discuss a treatment plan with your health care provider. Based on the stage of the cancer, one treatment or a combination of treatments may be recommended. The most common forms of treatment are:   Surgery. Procedures that may be done include transurethral resection and cystectomy.  Radiation therapy. This is infrequently used to treat bladder cancer.   Chemotherapy. During this treatment, drugs are used to kill cancer cells.  Immunotherapy. This is usually administered directly into the bladder. HOME CARE INSTRUCTIONS  Take medicines only as directed by your health care provider.   Maintain a healthy diet.   Consider joining a support group. This may help you learn to cope with the stress of having bladder cancer.   Seek advice to help you manage treatment side effects.   Keep all follow-up visits as directed by your health care provider.   Inform your cancer specialist if you  are admitted to the hospital.  [place] IF:  There is blood in your urine.  You have symptoms of a urinary tract infection. These include:  Tiredness.  Shakiness.  Weakness.  Muscle aches.  Abdominal pain.  Frequent and intense urge to urinate (in young women).  Burning feeling in the bladder or urethra during urination (in young women). SEEK IMMEDIATE MEDICAL CARE IF:  You are unable to urinate. This information is not intended  to replace advice given to you by your health care provider. Make sure you discuss any questions you have with your health care provider. Document Released: 11/27/2003 Document Revised: 12/15/2014 Document Reviewed: 05/17/2013 Elsevier Interactive Patient Education  2017 [place]   1) The drugs that you were given will stay in your system until tomorrow so for the next 24 hours you should not:  A) Drive an automobile B) Make any legal decisions C) Drink any alcoholic beverage   2) You may resume regular meals tomorrow.  Today it is better to start with liquids and gradually work up to solid foods.  You may eat anything you prefer, but it is better to start with liquids, then soup and crackers, and gradually work up to solid foods.   3) Please notify your doctor immediately if you have any unusual bleeding, trouble breathing, redness and pain at the surgery site, drainage, fever, or pain not relieved by medication.    4) Additional Instructions:        Please contact your physician with any problems or Same Day Surgery at [phone], Monday through Friday 6 am to 4 pm, or [place] at [facility] number at [phone].

## 2016-11-04 NOTE — Op Note (Addendum)
Date of procedure: 11/04/16  Preoperative diagnosis:  1. History of bladder cancer 2. History of left upper tract urothelial carcinoma status post left nephroureterectomy 3. Bladder cancer recurrence    Postoperative diagnosis:  1. Same as above   Procedure: 1. TURBT, small 2. Right retrograde pyelogram 3. Right ureteroscopy, diagnostic 4. Right ureteral stent placement 5. Intravesical instillation of mitomycin   Surgeon: [name], MD  Anesthesia: General  Complications: None  Intraoperative findings:  small papillary change on posterior aspect of stellate scar on right lateral wall bladder concerning for small recurrence. Diffuse erythema the bladder and nonspecific pattern throughout bladder. Heavily trabeculated. Irregular prostatic fossa without median lobe. Tortuosity of the right ureter. Negative ureteroscopy.  EBL: Minimal  Specimens: Bladder tumor of right lateral wall, bladder erythema  Drains: 6 x 26 French double-J ureteral stent with string in place  Indication: Eric Mullins is a 81 y.o. patient with a history of superficial bladder cancer as well as left upper tract urothelial carcinoma status post nephroureterectomy. On surveillance cystoscopy, there was evidence of small papillary changes in the bed of the stellate scar.  After reviewing the management options for treatment, he elected to proceed with the above surgical procedure(s). We have discussed the potential benefits and risks of the procedure, side effects of the proposed treatment, the likelihood of the patient achieving the goals of the procedure, and any potential problems that might occur during the procedure or recuperation. Informed consent has been obtained.  Description of procedure:  The patient was taken to the operating room and general anesthesia was induced.  The patient was placed in the dorsal lithotomy position, prepped and draped in the usual sterile fashion, and preoperative  antibiotics were administered. A preoperative time-out was performed.   A 21 French cystoscope was advanced per urethra into the bladder. Of note, there is irregularity with a mucosal bridge within the prostatic fossa with bilobar coaptation but no median lobe. This is consistent with his previous history of holmium laser enucleation of the prostate. The bladder neck was no longer elevated without median lobe. Inspection of the bladder revealed a heavily trabeculated bladder with saccules throughout. The left UO was surgically absent. The right UO was in normal anatomic position with clear reflux. There is a stellate scar on the right posterior wall bladder at the site of his previous resection with some fine papillary changes, less than 5 mm, on the posterior aspect of this concerning for small superficial recurrence. There is also some diffuse erythema throughout the bladder but no particular pattern.  Attention was turned to the right ureteral orifice was cannulated using a 5 [place] open-ended ureteral catheter. Retrograde pyelogram was performed which revealed a decompressed ureter with some tortuosity in the mid/distal ureter and some angulation with what appeared to be a possible secular area without a filling defect. This was somewhat unusual but not particularly concerning. The remainder of the upper tract appeared decompressed without filling defects. Given the concern for possible ureteral abnormality, did place a sensor wire up to level of the kidney at this point in time. The 5 [place] open-ended ureteral catheter was then advanced to the proximal ureter and the sensor wire was exchanged for superstiff wire. At this point time, a flexible 7 [place] Wolf ureteroscope was advanced up to level of the renal pelvis. Formal pyeloscopy was then performed inspecting each and every calyx. The previous retrograde pyelogram was used as a roadmap to ensure that each calyx had been directly visualized. There  is no  tumor, stones, or any other changes that were concerning within the upper tract. The scope was backed down the length of the ureter inspecting along the way. At the site of the bulbous change within the ureter, there was a angulation of the ureter but no evidence of tumor or abnormalities. Just prior to removing the scope, the wire was replaced and snapped in place for the purpose of placing a stent at the end of case.  Next, cold cup biopsies were used to biopsy the very fine papillary change overlying the stellate scar. This passed off the field as bladder tumor. Additional random bladder biopsies were performed primarily in areas of erythema on the dome, posterior wall, left and right lateral walls of the bladder.  These were passed off the field as bladder erythema and represent random bladder biopsies.  Finally, adequate hemostasis was achieved using Bugbee electrocautery. Once hemostasis was adequate, the safety wire was backloaded over the rigid cystoscope. A 6 x 26 French double-J ureteral stent was advanced over this wire up to level of the renal pelvis. It was partially drawn until full coil was noted in the renal pelvis. The wire was then fully withdrawn and a full coil was noted within the bladder. The string was remained attached to the stent. An 50 French two-way Foley catheter was then placed carefully alongside the string using a catheter guide. The balloon was inflated with 20 cc of sterile water. The bladder was then drained.  The patient was cleaned and dried, repositioned the supine position, reversed from anesthesia,, and taken to the PACU in stable condition.  40 mg of mitomycin was instilled into the bladder and allowed to remain in the PACU for 1 hour 12 time. This was then drained and the catheter was carefully removed with care to avoid any dislodgment of the ureteral stent.  Plan: Plan to call patient with the pathology results. He will need a follow-up in 3 months for cystoscopy.   He can remove his own stent on Friday.  [name], M.D.

## 2016-11-04 NOTE — Interval H&P Note (Signed)
History and Physical Interval Note:  11/04/2016 9:34 AM  Eric Mullins  has presented today for surgery, with the diagnosis of CANCER OF LATERAL WALL OF URINARY BLADDER  The various methods of treatment have been discussed with the patient and family. After consideration of risks, benefits and other options for treatment, the patient has consented to  Procedure(s): TRANSURETHRAL RESECTION OF BLADDER TUMOR WITH MITOMYCIN-C (N/A) CYSTOSCOPY WITH RETROGRADE PYELOGRAM (Bilateral) as a surgical intervention .  The patient's history has been reviewed, patient examined, no change in status, stable for surgery.  I have reviewed the patient's chart and labs.  Questions were answered to the patient's satisfaction.     [name]

## 2016-11-04 NOTE — Anesthesia Procedure Notes (Signed)
Procedure Name: LMA Insertion Date/Time: 11/04/2016 9:59 AM Performed by: Kennon Holter Pre-anesthesia Checklist: Patient identified, Patient being monitored, Timeout performed, Emergency Drugs available and Suction available Patient Re-evaluated:Patient Re-evaluated prior to inductionOxygen Delivery Method: Circle system utilized Preoxygenation: Pre-oxygenation with 100% oxygen Intubation Type: IV induction Ventilation: Mask ventilation without difficulty LMA: LMA inserted LMA Size: 4.5 Tube type: Oral Number of attempts: 1 Placement Confirmation: positive ETCO2 and breath sounds checked- equal and bilateral Tube secured with: Tape Dental Injury: Teeth and Oropharynx as per pre-operative assessment

## 2016-11-04 NOTE — Anesthesia Postprocedure Evaluation (Signed)
Anesthesia Post Note  Patient: NIGEL BUCHWALD  Procedure(s) Performed: Procedure(s) (LRB): TRANSURETHRAL RESECTION OF BLADDER TUMOR WITH MITOMYCIN-C (N/A) URETEROSCOPY (Right) CYSTOSCOPY WITH RETROGRADE PYELOGRAM/URETERAL STENT PLACEMENT (Right)  Patient location during evaluation: PACU Anesthesia Type: General Level of consciousness: awake and alert and oriented Pain management: pain level controlled Vital Signs Assessment: post-procedure vital signs reviewed and stable Respiratory status: spontaneous breathing, nonlabored ventilation and respiratory function stable Cardiovascular status: blood pressure returned to baseline and stable Postop Assessment: no signs of nausea or vomiting Anesthetic complications: no    Last Vitals:  Vitals:   11/04/16 1115 11/04/16 1130  BP: (!) 135/55 (!) 126/56  Pulse: 76 74  Resp: 18 (!) 21  Temp: 36.2 C     Last Pain:  Vitals:   11/04/16 1102  TempSrc: Temporal                 Leasha Goldberger

## 2016-11-05 ENCOUNTER — Encounter: Payer: Self-pay | Admitting: Urology

## 2016-11-05 DIAGNOSIS — D494 Neoplasm of unspecified behavior of bladder: Secondary | ICD-10-CM | POA: Diagnosis not present

## 2016-11-05 DIAGNOSIS — N2889 Other specified disorders of kidney and ureter: Secondary | ICD-10-CM | POA: Diagnosis not present

## 2016-11-05 LAB — SURGICAL PATHOLOGY

## 2016-11-07 ENCOUNTER — Telehealth: Payer: Self-pay | Admitting: Urology

## 2016-11-07 NOTE — Telephone Encounter (Signed)
Surgical pathology reviewed by telephone today.  Consistent with small recurrence of low-grade TA disease.  He did remove his own stent today.  Plan for follow-up for office cystoscopy in 3 months as previously scheduled.  [name], MD

## 2016-12-05 ENCOUNTER — Emergency Department: Payer: PPO

## 2016-12-05 ENCOUNTER — Inpatient Hospital Stay
Admission: EM | Admit: 2016-12-05 | Discharge: 2016-12-08 | DRG: 190 | Disposition: A | Discharge status: Home Health | Payer: PPO | Attending: Internal Medicine | Admitting: Internal Medicine

## 2016-12-05 ENCOUNTER — Encounter: Payer: Self-pay | Admitting: *Deleted

## 2016-12-05 DIAGNOSIS — R262 Difficulty in walking, not elsewhere classified: Secondary | ICD-10-CM

## 2016-12-05 DIAGNOSIS — I110 Hypertensive heart disease with heart failure: Secondary | ICD-10-CM | POA: Diagnosis not present

## 2016-12-05 DIAGNOSIS — Z96652 Presence of left artificial knee joint: Secondary | ICD-10-CM | POA: Diagnosis not present

## 2016-12-05 DIAGNOSIS — Z9621 Cochlear implant status: Secondary | ICD-10-CM | POA: Diagnosis not present

## 2016-12-05 DIAGNOSIS — Z7982 Long term (current) use of aspirin: Secondary | ICD-10-CM | POA: Diagnosis not present

## 2016-12-05 DIAGNOSIS — F17200 Nicotine dependence, unspecified, uncomplicated: Secondary | ICD-10-CM | POA: Diagnosis not present

## 2016-12-05 DIAGNOSIS — E785 Hyperlipidemia, unspecified: Secondary | ICD-10-CM | POA: Diagnosis not present

## 2016-12-05 DIAGNOSIS — Z79899 Other long term (current) drug therapy: Secondary | ICD-10-CM

## 2016-12-05 DIAGNOSIS — Z905 Acquired absence of kidney: Secondary | ICD-10-CM

## 2016-12-05 DIAGNOSIS — Z961 Presence of intraocular lens: Secondary | ICD-10-CM | POA: Diagnosis not present

## 2016-12-05 DIAGNOSIS — F17290 Nicotine dependence, other tobacco product, uncomplicated: Secondary | ICD-10-CM | POA: Diagnosis not present

## 2016-12-05 DIAGNOSIS — I43 Cardiomyopathy in diseases classified elsewhere: Secondary | ICD-10-CM | POA: Diagnosis not present

## 2016-12-05 DIAGNOSIS — R0602 Shortness of breath: Secondary | ICD-10-CM

## 2016-12-05 DIAGNOSIS — N401 Enlarged prostate with lower urinary tract symptoms: Secondary | ICD-10-CM | POA: Diagnosis not present

## 2016-12-05 DIAGNOSIS — R0600 Dyspnea, unspecified: Secondary | ICD-10-CM | POA: Diagnosis not present

## 2016-12-05 DIAGNOSIS — Z808 Family history of malignant neoplasm of other organs or systems: Secondary | ICD-10-CM

## 2016-12-05 DIAGNOSIS — K219 Gastro-esophageal reflux disease without esophagitis: Secondary | ICD-10-CM | POA: Diagnosis not present

## 2016-12-05 DIAGNOSIS — R338 Other retention of urine: Secondary | ICD-10-CM | POA: Diagnosis not present

## 2016-12-05 DIAGNOSIS — Z8673 Personal history of transient ischemic attack (TIA), and cerebral infarction without residual deficits: Secondary | ICD-10-CM

## 2016-12-05 DIAGNOSIS — R0603 Acute respiratory distress: Secondary | ICD-10-CM | POA: Diagnosis present

## 2016-12-05 DIAGNOSIS — I10 Essential (primary) hypertension: Secondary | ICD-10-CM | POA: Diagnosis not present

## 2016-12-05 DIAGNOSIS — J441 Chronic obstructive pulmonary disease with (acute) exacerbation: Secondary | ICD-10-CM | POA: Diagnosis not present

## 2016-12-05 DIAGNOSIS — I5021 Acute systolic (congestive) heart failure: Secondary | ICD-10-CM | POA: Diagnosis not present

## 2016-12-05 DIAGNOSIS — J810 Acute pulmonary edema: Secondary | ICD-10-CM

## 2016-12-05 LAB — TROPONIN I: TROPONIN I: 0.11 ng/mL — AB (ref <0.03)

## 2016-12-05 LAB — CBC
HEMATOCRIT: 47.3 % (ref 40.0–52.0)
HEMOGLOBIN: 15.7 g/dL (ref 13.0–18.0)
MCH: 30.4 pg (ref 26.0–34.0)
MCHC: 33.2 g/dL (ref 32.0–36.0)
MCV: 91.7 fL (ref 80.0–100.0)
Platelets: 129 10*3/uL — ABNORMAL LOW (ref 150–440)
RBC: 5.15 MIL/uL (ref 4.40–5.90)
RDW: 14.6 % — AB (ref 11.5–14.5)
WBC: 6.6 10*3/uL (ref 3.8–10.6)

## 2016-12-05 LAB — COMPREHENSIVE METABOLIC PANEL
ALBUMIN: 4 g/dL (ref 3.5–5.0)
ALK PHOS: 91 U/L (ref 38–126)
ALT: 111 U/L — ABNORMAL HIGH (ref 17–63)
ANION GAP: 9 (ref 5–15)
AST: 176 U/L — ABNORMAL HIGH (ref 15–41)
BILIRUBIN TOTAL: 1 mg/dL (ref 0.3–1.2)
BUN: 23 mg/dL — ABNORMAL HIGH (ref 6–20)
CALCIUM: 9.3 mg/dL (ref 8.9–10.3)
CO2: 23 mmol/L (ref 22–32)
Chloride: 108 mmol/L (ref 101–111)
Creatinine, Ser: 1.32 mg/dL — ABNORMAL HIGH (ref 0.61–1.24)
GFR, EST AFRICAN AMERICAN: 58 mL/min — AB (ref >60)
GFR, EST NON AFRICAN AMERICAN: 50 mL/min — AB (ref >60)
GLUCOSE: 183 mg/dL — AB (ref 65–99)
POTASSIUM: 4.8 mmol/L (ref 3.5–5.1)
Sodium: 140 mmol/L (ref 135–145)
TOTAL PROTEIN: 6.9 g/dL (ref 6.5–8.1)

## 2016-12-05 LAB — BRAIN NATRIURETIC PEPTIDE: B NATRIURETIC PEPTIDE 5: 2678 pg/mL — AB (ref 0.0–100.0)

## 2016-12-05 MED ORDER — ASPIRIN 81 MG PO CHEW
324.0000 mg | CHEWABLE_TABLET | Freq: Once | ORAL | Status: AC
  Administered 2016-12-05: 324 mg via ORAL
  Filled 2016-12-05: qty 4

## 2016-12-05 MED ORDER — FUROSEMIDE 10 MG/ML IJ SOLN
60.0000 mg | Freq: Once | INTRAMUSCULAR | Status: AC
  Administered 2016-12-05: 60 mg via INTRAVENOUS
  Filled 2016-12-05: qty 8

## 2016-12-05 MED ORDER — IPRATROPIUM-ALBUTEROL 0.5-2.5 (3) MG/3ML IN SOLN
3.0000 mL | Freq: Once | RESPIRATORY_TRACT | Status: AC
  Administered 2016-12-05: 3 mL via RESPIRATORY_TRACT
  Filled 2016-12-05: qty 3

## 2016-12-05 MED ORDER — METHYLPREDNISOLONE SODIUM SUCC 125 MG IJ SOLR: 125.0000 mg | Freq: Once | INTRAMUSCULAR | Status: DC

## 2016-12-05 NOTE — ED Notes (Signed)
Pt on bipap with RT in with pt.  Pt has sob for 2 days, worse today.  Denies chest pain or tightness.  Pt alert.  md at bedside.  Skin warm and dry.  nsr on monitor.

## 2016-12-05 NOTE — ED Notes (Signed)
Patient's O2 saturation 94% on 3L [place]. No acute distress noted by RN. RN will continue to monitor

## 2016-12-05 NOTE — ED Notes (Signed)
Report off to jenna rn  

## 2016-12-05 NOTE — ED Provider Notes (Addendum)
[facility] Emergency Department Provider Note  Time seen: 9:58 PM  I have reviewed the triage vital signs and the nursing notes.   HISTORY  Chief Complaint Respiratory Distress    HPI Eric Mullins is a 81 y.o. male with a past medical history of COPD, hypertension, hyperlipidemia, presents the emergency department via EMS for difficulty breathing.According to EMS reported patient was brought from home for difficulty breathing. Patient has a history of COPD but no home O2 requirement. EMS. They stated initial room air saturation of 55% by fire department. Patient was placed on CPAP, Solu-Medrol and nebulizer treatments were given in route to the hospital. No reported cough or fever. Patient states he is breathing much better on the CPAP machine.  Past Medical History:  Diagnosis Date  . BPH (benign prostatic hyperplasia)   . COPD (chronic obstructive pulmonary disease) (Eric Mullins)   . Elevated PSA   . Encounter for removal of urinary catheter   . GERD (gastroesophageal reflux disease)   . Gross hematuria   . Heart murmur   . Hyperlipidemia   . Hypertension   . Incomplete bladder emptying   . Kidney filling defect   . Paraphimosis   . 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 Stroke (Eric Mullins) 2010  . Urinary tract infection     Patient Active Problem List   Diagnosis Date Noted  . Bladder tumor 04/07/2016  . BPH (benign prostatic hypertrophy) with urinary retention 03/06/2016  . Urothelial carcinoma of kidney (Eric Mullins) 03/06/2016  . Elevated PSA 03/06/2016  . Brachial neuritis 06/16/2012  . Thoracic and lumbosacral neuritis 06/16/2012  . COPD (chronic obstructive pulmonary disease) (Eric Mullins) 12/24/2011  . 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 LBP (low back pain) 12/24/2011  . Eczema 08/27/2011  . Benign prostatic hyperplasia with urinary obstruction 08/25/2011  . Restless leg 08/25/2011  . 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 Hyperlipidemia, unspecified  06/20/2011  . AI (aortic incompetence) 06/20/2011    Past Surgical History:  Procedure Laterality Date  . APPENDECTOMY    . BACK SURGERY     lower back. patient unsure if there is metal in back  . CATARACT EXTRACTION W/ INTRAOCULAR LENS  IMPLANT, BILATERAL Bilateral   . COCHLEAR IMPLANT Left    middle ear implant  . CYSTOSCOPY N/A 04/07/2016   Procedure: Fulgeration and evacuation of clot;  Surgeon: Eric Espy, MD;  Location: [facility] ORS;  Service: Urology;  Laterality: N/A;  . CYSTOSCOPY W/ RETROGRADES Right 04/07/2016   Procedure: CYSTOSCOPY WITH RETROGRADE PYELOGRAM;  Surgeon: Eric Espy, MD;  Location: [facility] ORS;  Service: Urology;  Laterality: Right;  . CYSTOSCOPY W/ URETERAL STENT PLACEMENT Right 11/04/2016   Procedure: CYSTOSCOPY WITH RETROGRADE PYELOGRAM/URETERAL STENT PLACEMENT;  Surgeon: Eric Espy, MD;  Location: [facility] ORS;  Service: Urology;  Laterality: Right;  . EAR MASTOIDECTOMY W/ COCHLEAR IMPLANT W/ LANDMARK    . ELBOW SURGERY Left    ball and joint replaced  . FOOT SURGERY Left 1980   lawn mower accident  . HOLEP-LASER ENUCLEATION OF THE PROSTATE WITH MORCELLATION N/A 04/07/2016   Procedure: HOLEP-LASER ENUCLEATION OF THE PROSTATE WITH MORCELLATION/ TURBT;  Surgeon: Eric Espy, MD;  Location: [facility] ORS;  Service: Urology;  Laterality: N/A;  . TONSILLECTOMY    . TOTAL KNEE ARTHROPLASTY Left   . TOTAL NEPHRECTOMY Left 11/2013  . TRANSURETHRAL RESECTION OF BLADDER TUMOR WITH MITOMYCIN-C N/A 11/04/2016   Procedure: TRANSURETHRAL RESECTION OF BLADDER TUMOR WITH MITOMYCIN-C;  Surgeon: Eric Espy, MD;  Location: [facility] ORS;  Service: Urology;  Laterality: N/A;  . URETEROSCOPY Right 11/04/2016   Procedure: URETEROSCOPY;  Surgeon: Eric Espy, MD;  Location: [facility] ORS;  Service: Urology;  Laterality: Right;    Prior to Admission medications   Medication Sig Start Date End Date Taking? Authorizing Provider  albuterol (PROAIR HFA) 108 (90 Base) MCG/ACT inhaler  Inhale 1-2 puffs into the lungs every 4 (four) hours as needed.  07/07/13   Historical Provider, MD  amLODipine (NORVASC) 5 MG tablet Take 5 mg by mouth at bedtime.     Historical Provider, MD  aspirin 81 MG tablet Take 81 mg by mouth daily.    Historical Provider, MD  carvedilol (COREG) 3.125 MG tablet Take 1 tablet by mouth 2 (two) times daily. 09/02/16   Historical Provider, MD  ferrous sulfate 324 (65 FE) MG TBEC Take 1 tablet by mouth daily.    Historical Provider, MD  finasteride (PROSCAR) 5 MG tablet Take 1 tablet (5 mg total) by mouth daily. Patient not taking: Reported on 11/04/2016 03/06/16   Eric Beach A McGowan, PA-C  Fluticasone-Salmeterol (ADVAIR) 250-50 MCG/DOSE AEPB Inhale 1 puff into the lungs 2 (two) times daily.    Historical Provider, MD  gabapentin (NEURONTIN) 300 MG capsule Take 300 mg by mouth at bedtime. Reported on 03/06/2016    Historical Provider, MD  HYDROcodone-acetaminophen (NORCO/VICODIN) 5-325 MG tablet Take 1-2 tablets by mouth every 6 (six) hours as needed for moderate pain. 11/04/16   Eric Espy, MD  ipratropium-albuterol (DUONEB) 0.5-2.5 (3) MG/3ML SOLN Take 3 mLs by nebulization every 4 (four) hours as needed. 07/09/15   Arlis Porta., MD  lisinopril (PRINIVIL,ZESTRIL) 10 MG tablet Take 10 mg by mouth daily.    Historical Provider, MD  Multiple Vitamins-Minerals (EYE HEALTH) CAPS Take 1 capsule by mouth daily.     Historical Provider, MD  pravastatin (PRAVACHOL) 40 MG tablet Take 40 mg by mouth at bedtime.     Historical Provider, MD  ranitidine (ZANTAC) 75 MG tablet Take 75 mg by mouth 2 (two) times daily as needed. Reported on 03/06/2016    Historical Provider, MD  rOPINIRole (REQUIP) 1 MG tablet Take 1 mg by mouth at bedtime.  04/07/13   Historical Provider, MD  triamcinolone cream (KENALOG) 0.1 % Apply 1 application topically 2 (two) times daily as needed.     Historical Provider, MD    No Known Allergies  Family History  Problem Relation Age of Onset  .  Brain cancer Father   . Lung cancer Father   . Kidney disease Neg Hx   . Prostate cancer Neg Hx     Social History Social History  Substance Use Topics  . Smoking status: Current Every Day Smoker    Types: Pipe  . Smokeless tobacco: Never Used     Comment: no other passive smokers in home  . Alcohol use No    Review of Systems Constitutional: Negative for fever. Cardiovascular: Negative for chest pain. Respiratory: Negative for shortness of breath. Gastrointestinal: Negative for abdominal pain Neurological: Negative for headache 10-point ROS otherwise negative.  ____________________________________________   PHYSICAL EXAM:  VITAL SIGNS: ED Triage Vitals  Enc Vitals Group     BP 12/05/16 2156 (!) 158/95     Pulse Rate 12/05/16 2156 88     Resp 12/05/16 2156 (!) 26     Temp --      Temp src --      SpO2 12/05/16 2156 98 %  Weight 12/05/16 2157 140 lb (63.5 kg)     Height 12/05/16 2157 5\' 8"  (1.727 m)     Head Circumference --      Peak Flow --      Pain Score --      Pain Loc --      Pain Edu? --      Excl. in [place]? --     Constitutional: Alert and oriented. Currently on CPAP, but no distress. Able to speak in 2-3 word sentences. Eyes: Normal exam ENT   Head: Normocephalic and atraumatic.   Mouth/Throat: Mucous membranes are moist. Cardiovascular: Normal rate, regular rhythm. No murmur Respiratory: Mild tachypnea, moderate expiratory wheeze bilaterally. No obvious rales or rhonchi. Gastrointestinal: Soft and nontender. No distention. Musculoskeletal: Nontender with normal range of motion in all extremities Neurologic:  Normal speech and language. No gross focal neurologic deficits Skin:  Skin is warm, dry and intact.  Psychiatric: Mood and affect are normal  ____________________________________________    EKG  EKG reviewed and interpreted by myself shows sinus rhythm at 89 bpm, slightly widened QRS, normal axis, slightly prolonged QTC of 555 ms,  nonspecific ST changes with T-wave inversions in lateral leads. No ST elevations.  ____________________________________________    RADIOLOGY  Chest x-ray consistent with possible pulmonary edema.  ____________________________________________   INITIAL IMPRESSION / ASSESSMENT AND PLAN / ED COURSE  Pertinent labs & imaging results that were available during my care of the patient were reviewed by me and considered in my medical decision making (see chart for details).  Patient presents the emergency department with difficulty breathing. Found to have a room air saturation in the 50s per EMS. Patient doing much better on BiPAP, currently 97% on BiPAP. Patient has a history of COPD with no baseline home O2 requirement. Patient has received Solu-Medrol by EMS, we will check labs, chest x-ray continue to dose DuoNeb's in the emergency department while awaiting results. Patient will require admission to the hospital for his COPD exacerbation.  Patient has possible edema on x-ray in addition to COPD exacerbation. BNP is elevated at 2600. Troponin elevated 0.11. We will dose IV Lasix. Patient appears much more comfortable on BiPAP, 96% on 40% BiPAP. We will do a trial off BiPAP to see how the patient does to decide upon ICU versus stepdown admission.  Patient tolerating off BiPAP on 2 L very well maintains 94-96% on 2 L. Patient will be admitted to the hospital for further treatment.   CRITICAL CARE Performed by: Harvest Dark   Total critical care time: 30 minutes  Critical care time was exclusive of separately billable procedures and treating other patients.  Critical care was necessary to treat or prevent imminent or life-threatening deterioration.  Critical care was time spent personally by me on the following activities: development of treatment plan with patient and/or surrogate as well as nursing, discussions with consultants, evaluation of patient's response to treatment,  examination of patient, obtaining history from patient or surrogate, ordering and performing treatments and interventions, ordering and review of laboratory studies, ordering and review of radiographic studies, pulse oximetry and re-evaluation of patient's condition.   ____________________________________________   FINAL CLINICAL IMPRESSION(S) / ED DIAGNOSES  COPD exacerbation Dyspnea Pulmonary edema   Harvest Dark, MD 12/05/16 [id number]    Harvest Dark, MD 12/05/16 [phone]

## 2016-12-05 NOTE — ED Triage Notes (Signed)
Pt brought in via ems from home with diff breathing  Hx copd.  Pt on cpap on arrival to treatment room  Pt alert.  md at bedside.  Iv in place and ems gave 125 solumedrol.

## 2016-12-05 NOTE — ED Notes (Signed)
Patient taken off Bipap and placed on 2L [place] per MD [facility] request. MD present for bipap removal. Patient O2 saturation decreased to 91% on 2L. Pt O2 increased to 3L. Pt O2 saturation currently 94% on 3L. RN will continue to monitor.

## 2016-12-06 ENCOUNTER — Inpatient Hospital Stay
Admit: 2016-12-06 | Discharge: 2016-12-06 | Disposition: A | Discharge status: Home / Self Care | Payer: PPO | Attending: Internal Medicine | Admitting: Internal Medicine

## 2016-12-06 DIAGNOSIS — Z808 Family history of malignant neoplasm of other organs or systems: Secondary | ICD-10-CM | POA: Diagnosis not present

## 2016-12-06 DIAGNOSIS — I10 Essential (primary) hypertension: Secondary | ICD-10-CM | POA: Diagnosis not present

## 2016-12-06 DIAGNOSIS — R0602 Shortness of breath: Secondary | ICD-10-CM | POA: Diagnosis not present

## 2016-12-06 DIAGNOSIS — J441 Chronic obstructive pulmonary disease with (acute) exacerbation: Secondary | ICD-10-CM | POA: Diagnosis present

## 2016-12-06 DIAGNOSIS — I429 Cardiomyopathy, unspecified: Secondary | ICD-10-CM | POA: Diagnosis not present

## 2016-12-06 DIAGNOSIS — I509 Heart failure, unspecified: Secondary | ICD-10-CM | POA: Diagnosis not present

## 2016-12-06 DIAGNOSIS — I5021 Acute systolic (congestive) heart failure: Secondary | ICD-10-CM | POA: Diagnosis present

## 2016-12-06 DIAGNOSIS — K219 Gastro-esophageal reflux disease without esophagitis: Secondary | ICD-10-CM | POA: Diagnosis not present

## 2016-12-06 DIAGNOSIS — Z7982 Long term (current) use of aspirin: Secondary | ICD-10-CM | POA: Diagnosis not present

## 2016-12-06 DIAGNOSIS — Z79899 Other long term (current) drug therapy: Secondary | ICD-10-CM | POA: Diagnosis not present

## 2016-12-06 DIAGNOSIS — R338 Other retention of urine: Secondary | ICD-10-CM | POA: Diagnosis not present

## 2016-12-06 DIAGNOSIS — E785 Hyperlipidemia, unspecified: Secondary | ICD-10-CM | POA: Diagnosis not present

## 2016-12-06 DIAGNOSIS — I43 Cardiomyopathy in diseases classified elsewhere: Secondary | ICD-10-CM | POA: Diagnosis not present

## 2016-12-06 DIAGNOSIS — F17290 Nicotine dependence, other tobacco product, uncomplicated: Secondary | ICD-10-CM | POA: Diagnosis not present

## 2016-12-06 DIAGNOSIS — R0603 Acute respiratory distress: Secondary | ICD-10-CM | POA: Diagnosis not present

## 2016-12-06 DIAGNOSIS — Z96652 Presence of left artificial knee joint: Secondary | ICD-10-CM | POA: Diagnosis not present

## 2016-12-06 DIAGNOSIS — Z9621 Cochlear implant status: Secondary | ICD-10-CM | POA: Diagnosis not present

## 2016-12-06 DIAGNOSIS — N401 Enlarged prostate with lower urinary tract symptoms: Secondary | ICD-10-CM | POA: Diagnosis not present

## 2016-12-06 DIAGNOSIS — I110 Hypertensive heart disease with heart failure: Secondary | ICD-10-CM | POA: Diagnosis not present

## 2016-12-06 DIAGNOSIS — Z8673 Personal history of transient ischemic attack (TIA), and cerebral infarction without residual deficits: Secondary | ICD-10-CM | POA: Diagnosis not present

## 2016-12-06 DIAGNOSIS — Z905 Acquired absence of kidney: Secondary | ICD-10-CM | POA: Diagnosis not present

## 2016-12-06 DIAGNOSIS — Z961 Presence of intraocular lens: Secondary | ICD-10-CM | POA: Diagnosis not present

## 2016-12-06 LAB — CBC
HEMATOCRIT: 47.1 % (ref 40.0–52.0)
HEMOGLOBIN: 16 g/dL (ref 13.0–18.0)
MCH: 30.5 pg (ref 26.0–34.0)
MCHC: 34 g/dL (ref 32.0–36.0)
MCV: 89.7 fL (ref 80.0–100.0)
Platelets: 135 10*3/uL — ABNORMAL LOW (ref 150–440)
RBC: 5.25 MIL/uL (ref 4.40–5.90)
RDW: 14.6 % — ABNORMAL HIGH (ref 11.5–14.5)
WBC: 5.7 10*3/uL (ref 3.8–10.6)

## 2016-12-06 LAB — BASIC METABOLIC PANEL
Anion gap: 10 (ref 5–15)
BUN: 26 mg/dL — AB (ref 6–20)
CHLORIDE: 109 mmol/L (ref 101–111)
CO2: 22 mmol/L (ref 22–32)
Calcium: 9.5 mg/dL (ref 8.9–10.3)
Creatinine, Ser: 1.31 mg/dL — ABNORMAL HIGH (ref 0.61–1.24)
GFR calc non Af Amer: 50 mL/min — ABNORMAL LOW (ref >60)
GFR, EST AFRICAN AMERICAN: 58 mL/min — AB (ref >60)
Glucose, Bld: 111 mg/dL — ABNORMAL HIGH (ref 65–99)
POTASSIUM: 4.7 mmol/L (ref 3.5–5.1)
SODIUM: 141 mmol/L (ref 135–145)

## 2016-12-06 LAB — ECHOCARDIOGRAM COMPLETE
Height: 68 in
Weight: 2232 oz

## 2016-12-06 LAB — TROPONIN I
Troponin I: 0.1 ng/mL (ref <0.03)
Troponin I: 0.08 ng/mL (ref <0.03)
Troponin I: 0.06 ng/mL (ref <0.03)

## 2016-12-06 MED ORDER — SACUBITRIL-VALSARTAN 24-26 MG PO TABS
1.0000 | ORAL_TABLET | Freq: Two times a day (BID) | ORAL | Status: DC
  Administered 2016-12-07 – 2016-12-08 (×2): 1 via ORAL
  Filled 2016-12-06 (×2): qty 1

## 2016-12-06 MED ORDER — FUROSEMIDE 10 MG/ML IJ SOLN
40.0000 mg | Freq: Two times a day (BID) | INTRAMUSCULAR | Status: DC
  Administered 2016-12-06 – 2016-12-08 (×4): 40 mg via INTRAVENOUS
  Filled 2016-12-06 (×4): qty 4

## 2016-12-06 MED ORDER — AZITHROMYCIN 500 MG IV SOLR
500.0000 mg | INTRAVENOUS | Status: DC
  Administered 2016-12-06 – 2016-12-07 (×2): 500 mg via INTRAVENOUS
  Filled 2016-12-06 (×3): qty 500

## 2016-12-06 MED ORDER — SPIRONOLACTONE 25 MG PO TABS
25.0000 mg | ORAL_TABLET | Freq: Every day | ORAL | Status: DC
  Administered 2016-12-06 – 2016-12-08 (×3): 25 mg via ORAL
  Filled 2016-12-06 (×3): qty 1

## 2016-12-06 MED ORDER — CARVEDILOL 3.125 MG PO TABS
3.1250 mg | ORAL_TABLET | Freq: Two times a day (BID) | ORAL | Status: DC
  Administered 2016-12-06: 3.125 mg via ORAL
  Filled 2016-12-06: qty 1

## 2016-12-06 MED ORDER — SODIUM CHLORIDE 0.9% FLUSH
3.0000 mL | Freq: Two times a day (BID) | INTRAVENOUS | Status: DC
  Administered 2016-12-06 – 2016-12-08 (×6): 3 mL via INTRAVENOUS

## 2016-12-06 MED ORDER — METHYLPREDNISOLONE SODIUM SUCC 125 MG IJ SOLR
60.0000 mg | Freq: Four times a day (QID) | INTRAMUSCULAR | Status: DC
  Administered 2016-12-06 – 2016-12-07 (×6): 60 mg via INTRAVENOUS
  Filled 2016-12-06 (×6): qty 2

## 2016-12-06 MED ORDER — ROPINIROLE HCL 1 MG PO TABS
1.0000 mg | ORAL_TABLET | Freq: Every day | ORAL | Status: DC
  Administered 2016-12-07: 1 mg via ORAL
  Filled 2016-12-06 (×2): qty 1

## 2016-12-06 MED ORDER — GUAIFENESIN-DM 100-10 MG/5ML PO SYRP
5.0000 mL | ORAL_SOLUTION | ORAL | Status: DC | PRN
  Administered 2016-12-06 – 2016-12-08 (×5): 5 mL via ORAL
  Filled 2016-12-06 (×5): qty 5

## 2016-12-06 MED ORDER — AMLODIPINE BESYLATE 5 MG PO TABS
5.0000 mg | ORAL_TABLET | Freq: Every day | ORAL | Status: DC
Start: 2016-12-06 — End: 2016-12-06

## 2016-12-06 MED ORDER — HYDROCODONE-ACETAMINOPHEN 5-325 MG PO TABS: 1.0000 | ORAL_TABLET | Freq: Four times a day (QID) | ORAL | Status: DC | PRN

## 2016-12-06 MED ORDER — DIGOXIN 125 MCG PO TABS
0.1250 mg | ORAL_TABLET | Freq: Every day | ORAL | Status: DC
  Administered 2016-12-06 – 2016-12-08 (×3): 0.125 mg via ORAL
  Filled 2016-12-06 (×3): qty 1

## 2016-12-06 MED ORDER — ACETAMINOPHEN 650 MG RE SUPP: 650.0000 mg | Freq: Four times a day (QID) | RECTAL | Status: DC | PRN

## 2016-12-06 MED ORDER — IPRATROPIUM-ALBUTEROL 0.5-2.5 (3) MG/3ML IN SOLN
3.0000 mL | RESPIRATORY_TRACT | Status: DC | PRN
  Administered 2016-12-06 – 2016-12-07 (×2): 3 mL via RESPIRATORY_TRACT
  Filled 2016-12-06 (×2): qty 3

## 2016-12-06 MED ORDER — ALUM & MAG HYDROXIDE-SIMETH 200-200-20 MG/5ML PO SUSP
15.0000 mL | Freq: Four times a day (QID) | ORAL | Status: DC | PRN
  Administered 2016-12-06: 15 mL via ORAL
  Filled 2016-12-06: qty 30

## 2016-12-06 MED ORDER — ENOXAPARIN SODIUM 40 MG/0.4ML ~~LOC~~ SOLN
40.0000 mg | SUBCUTANEOUS | Status: DC
  Administered 2016-12-06 – 2016-12-07 (×2): 40 mg via SUBCUTANEOUS
  Filled 2016-12-06 (×2): qty 0.4

## 2016-12-06 MED ORDER — CARVEDILOL 6.25 MG PO TABS
6.2500 mg | ORAL_TABLET | Freq: Two times a day (BID) | ORAL | Status: DC
Start: 2016-12-06 — End: 2016-12-08
  Administered 2016-12-06 – 2016-12-08 (×4): 6.25 mg via ORAL
  Filled 2016-12-06 (×4): qty 1

## 2016-12-06 MED ORDER — ATORVASTATIN CALCIUM 20 MG PO TABS
40.0000 mg | ORAL_TABLET | Freq: Every evening | ORAL | Status: DC
  Administered 2016-12-06 – 2016-12-07 (×2): 40 mg via ORAL
  Filled 2016-12-06 (×2): qty 2

## 2016-12-06 MED ORDER — ONDANSETRON HCL 4 MG PO TABS: 4.0000 mg | ORAL_TABLET | Freq: Four times a day (QID) | ORAL | Status: DC | PRN

## 2016-12-06 MED ORDER — LISINOPRIL 10 MG PO TABS
10.0000 mg | ORAL_TABLET | Freq: Every day | ORAL | Status: DC
  Administered 2016-12-06: 10 mg via ORAL
  Filled 2016-12-06: qty 1

## 2016-12-06 MED ORDER — ASPIRIN 81 MG PO CHEW
81.0000 mg | CHEWABLE_TABLET | Freq: Every day | ORAL | Status: DC
  Administered 2016-12-06 – 2016-12-08 (×3): 81 mg via ORAL
  Filled 2016-12-06 (×3): qty 1

## 2016-12-06 MED ORDER — FUROSEMIDE 10 MG/ML IJ SOLN
40.0000 mg | Freq: Once | INTRAMUSCULAR | Status: AC
  Administered 2016-12-06: 40 mg via INTRAVENOUS
  Filled 2016-12-06: qty 4

## 2016-12-06 MED ORDER — MOMETASONE FURO-FORMOTEROL FUM 200-5 MCG/ACT IN AERO
2.0000 | INHALATION_SPRAY | Freq: Two times a day (BID) | RESPIRATORY_TRACT | Status: DC
  Administered 2016-12-06 – 2016-12-08 (×5): 2 via RESPIRATORY_TRACT
  Filled 2016-12-06: qty 8.8

## 2016-12-06 MED ORDER — ACETAMINOPHEN 325 MG PO TABS: 650.0000 mg | ORAL_TABLET | Freq: Four times a day (QID) | ORAL | Status: DC | PRN

## 2016-12-06 MED ORDER — ONDANSETRON HCL 4 MG/2ML IJ SOLN: 4.0000 mg | Freq: Four times a day (QID) | INTRAMUSCULAR | Status: DC | PRN

## 2016-12-06 NOTE — Care Management Note (Signed)
Case Management Note  Patient Details  Name: Eric Mullins MRN: [id number] Date of Birth: 1935-05-11  Subjective/Objective:     Case management Consult for assistance with medications. Eric Mullins has [employer].                 Action/Plan:   Expected Discharge Date:                  Expected Discharge Plan:     In-House Referral:     Discharge planning Services     Post Acute Care Choice:    Choice offered to:     DME Arranged:    DME Agency:     HH Arranged:    HH Agency:     Status of Service:     If discussed at [employer] of Stay Meetings, dates discussed:    Additional Comments:  Antionetta Ator A, RN 12/06/2016, 11:27 AM

## 2016-12-06 NOTE — Progress Notes (Signed)
*  PRELIMINARY RESULTS* Echocardiogram 2D Echocardiogram has been performed.  Eric Mullins 12/06/2016, 9:22 AM

## 2016-12-06 NOTE — Progress Notes (Signed)
[place] at [facility]                                                                                                                                                                                  Patient Demographics   Eric Mullins, is a 81 y.o. male, DOB - [birth year], [id number]  Admit date - 12/05/2016   Admitting Physician Lance Coon, MD  Outpatient Primary MD for the patient is Eric Glow McLean-Scocuzza, MD   LOS - 0  Subjective: Patient's breathing is improved. Still has some wheezing denies any chest pain    Review of Systems:   CONSTITUTIONAL: No documented fever. No fatigue, weakness. No weight gain, no weight loss.  EYES: No blurry or double vision.  ENT: No tinnitus. No postnasal drip. No redness of the oropharynx.  RESPIRATORY: Positive cough, no wheeze, no hemoptysis. Positive dyspnea.  CARDIOVASCULAR: No chest pain. No orthopnea. No palpitations. No syncope.  GASTROINTESTINAL: No nausea, no vomiting or diarrhea. No abdominal pain. No melena or hematochezia.  GENITOURINARY: No dysuria or hematuria.  ENDOCRINE: No polyuria or nocturia. No heat or cold intolerance.  HEMATOLOGY: No anemia. No bruising. No bleeding.  INTEGUMENTARY: No rashes. No lesions.  MUSCULOSKELETAL: No arthritis. No swelling. No gout.  NEUROLOGIC: No numbness, tingling, or ataxia. No seizure-type activity.  PSYCHIATRIC: No anxiety. No insomnia. No ADD.    Vitals:   Vitals:   12/06/16 0244 12/06/16 0247 12/06/16 0725 12/06/16 1122  BP: (!) 157/104 125/68 135/65 140/72  Pulse: (!) 146 77 98 97  Resp: 18   20  Temp: 98.2 F (36.8 C)   97.5 F (36.4 C)  TempSrc: Oral   Oral  SpO2: 91% 97%  94%  Weight:  139 lb 8 oz (63.3 kg)    Height:        Wt Readings from Last 3 Encounters:  12/06/16 139 lb 8 oz (63.3 kg)  11/04/16 146 lb (66.2 kg)  10/23/16 148 lb (67.1 kg)     Intake/Output Summary (Last 24 hours) at 12/06/16 1443 Last data filed at  12/06/16 1342  Gross per 24 hour  Intake              240 ml  Output             1810 ml  Net            -1570 ml    Physical Exam:   GENERAL: Pleasant-appearing in no apparent distress.  HEAD, EYES, EARS, NOSE AND THROAT: Atraumatic, normocephalic. Extraocular muscles are intact. Pupils equal and reactive to light. Sclerae anicteric. No conjunctival injection. No oro-pharyngeal erythema.  NECK: Supple. There is  no jugular venous distention. No bruits, no lymphadenopathy, no thyromegaly.  HEART: Regular rate and rhythm,. No murmurs, no rubs, no clicks.  LUNGS: Bilateral wheezing throughout both lungs. No accesory muscle use ABDOMEN: Soft, flat, nontender, nondistended. Has good bowel sounds. No hepatosplenomegaly appreciated.  EXTREMITIES: No evidence of any cyanosis, clubbing, or peripheral edema.  +2 pedal and radial pulses bilaterally.  NEUROLOGIC: The patient is alert, awake, and oriented x3 with no focal motor or sensory deficits appreciated bilaterally.  SKIN: Moist and warm with no rashes appreciated.  Psych: Not anxious, depressed LN: No inguinal LN enlargement    Antibiotics   Anti-infectives    Start     Dose/Rate Route Frequency Ordered Stop   12/06/16 0245  azithromycin (ZITHROMAX) 500 mg in dextrose 5 % 250 mL IVPB     500 mg 250 mL/hr over 60 Minutes Intravenous Every 24 hours 12/06/16 0244        Medications   Scheduled Meds: . aspirin  81 mg Oral Daily  . atorvastatin  40 mg Oral QPM  . azithromycin  500 mg Intravenous Q24H  . carvedilol  6.25 mg Oral BID  . digoxin  0.125 mg Oral Daily  . enoxaparin (LOVENOX) injection  40 mg Subcutaneous Q24H  . furosemide  40 mg Intravenous Q12H  . methylPREDNISolone (SOLU-MEDROL) injection  60 mg Intravenous Q6H  . mometasone-formoterol  2 puff Inhalation BID  . rOPINIRole  1 mg Oral QHS  . [START ON 12/07/2016] sacubitril-valsartan  1 tablet Oral BID  . sodium chloride flush  3 mL Intravenous Q12H  . spironolactone   25 mg Oral Daily   Continuous Infusions: PRN Meds:.acetaminophen **OR** acetaminophen, guaiFENesin-dextromethorphan, HYDROcodone-acetaminophen, ipratropium-albuterol, ondansetron **OR** ondansetron (ZOFRAN) IV   Data Review:   Micro Results No results found for this or any previous visit (from the past 240 hour(s)).  Radiology Reports Dg Chest Portable 1 View  Result Date: 12/05/2016 CLINICAL DATA:  Progressive shortness of breath for 2 days. EXAM: PORTABLE CHEST 1 VIEW COMPARISON:  Chest radiographs 05/22/2012, chest CT 11/13/2014 FINDINGS: The heart is enlarged, there is atherosclerosis of the thoracic aorta. Emphysema is again seen. Slightly more prominent interstitial coarsening than on prior exam. No evidence of pleural fluid. No pneumothorax. No confluent airspace disease. Some of the small pulmonary nodules on prior CT are identified in the right upper lung. No acute osseous abnormality is seen. IMPRESSION: 1. Cardiomegaly with atherosclerosis of the thoracic aorta. 2. Emphysema. There is progressive interstitial coarsening from prior radiograph, which may simply be emphysema, however superimposed pulmonary edema is difficult to exclude. Electronically Signed   By: Jeb Levering M.D.   On: 12/05/2016 22:37     CBC  Recent Labs Lab 12/05/16 2200 12/06/16 0259  WBC 6.6 5.7  HGB 15.7 16.0  HCT 47.3 47.1  PLT 129* 135*  MCV 91.7 89.7  MCH 30.4 30.5  MCHC 33.2 34.0  RDW 14.6* 14.6*    Chemistries   Recent Labs Lab 12/05/16 2200 12/06/16 0259  NA 140 141  K 4.8 4.7  CL 108 109  CO2 23 22  GLUCOSE 183* 111*  BUN 23* 26*  CREATININE 1.32* 1.31*  CALCIUM 9.3 9.5  AST 176*  --   ALT 111*  --   ALKPHOS 91  --   BILITOT 1.0  --    ------------------------------------------------------------------------------------------------------------------ estimated creatinine clearance is 40.9 mL/min (by C-G formula based on SCr of 1.31 mg/dL  (H)). ------------------------------------------------------------------------------------------------------------------ No results for input(s): HGBA1C in the last 72  hours. ------------------------------------------------------------------------------------------------------------------ No results for input(s): CHOL, HDL, LDLCALC, TRIG, CHOLHDL, LDLDIRECT in the last 72 hours. ------------------------------------------------------------------------------------------------------------------ No results for input(s): TSH, T4TOTAL, T3FREE, THYROIDAB in the last 72 hours.  Invalid input(s): FREET3 ------------------------------------------------------------------------------------------------------------------ No results for input(s): VITAMINB12, FOLATE, FERRITIN, TIBC, IRON, RETICCTPCT in the last 72 hours.  Coagulation profile No results for input(s): INR, PROTIME in the last 168 hours.  No results for input(s): DDIMER in the last 72 hours.  Cardiac Enzymes  Recent Labs Lab 12/05/16 2200 12/06/16 0259 12/06/16 0835  TROPONINI 0.11* 0.10* 0.08*   ------------------------------------------------------------------------------------------------------------------ Invalid input(s): POCBNP    Assessment & Plan   Pt is 81  With sob : 1. Acute on chronic COPD exacerbation (HCC)  continue IV Solu-Medrol, azithromycin, when necessary nebs and antitussive   2.  Acute systolic CHF (congestive heart failure) ([place]) - patient has diuresed well continue IV Lasix     continued therapy with Entresto 3. Essential HTN (hypertension) - continue home with Coreg and entresto 4. HLD (hyperlipidemia) - home dose statin 5.   GERD (gastroesophageal reflux disease) - home dose PPI     Code Status Orders        Start     Ordered   12/06/16 0245  Full code  Continuous     12/06/16 0244    Code Status History    Date Active Date Inactive Code Status Order ID Comments User Context   04/07/2016  6:55  PM 04/08/2016  9:03 PM Full Code [id number]  [name], MD Inpatient    Advance Directive Documentation   Flowsheet Row Most Recent Value  Type of Advance Directive  Healthcare Power of Attorney, Living will  Pre-existing out of facility DNR order (yellow form or pink MOST form)  No data  "MOST" Form in Place?  No data           Consults  cardiology   DVT Prophylaxis  Lovenox    Lab Results  Component Value Date   PLT 135 (L) 12/06/2016     Time Spent in minutes  28min  Greater than 50% of time spent in care coordination and counseling patient regarding the condition and plan of care.   Dustin Flock M.D on 12/06/2016 at 2:43 PM  Between 7am to 6pm - Pager - [phone]  After 6pm go to [email or web address] - password [email or web address] [place] [facility]   Office  [phone]

## 2016-12-06 NOTE — H&P (Signed)
[place] at [place] NAME: Eric Mullins    MR#:  [id number]  DATE OF BIRTH:  [birth year]  DATE OF ADMISSION:  12/05/2016  PRIMARY CARE PHYSICIAN: [name], MD   REQUESTING/REFERRING PHYSICIAN: Kerman Passey, MD  CHIEF COMPLAINT:   Chief Complaint  Patient presents with  . Respiratory Distress    HISTORY OF PRESENT ILLNESS:  Eric Mullins  is a 81 y.o. male who presents with Shortness of breath and significant cough. Patient was not found to have pneumonia on evaluation here, but was found to have pulmonary edema as well as symptoms of COPD exacerbation. His BNP was significantly elevated. He has no prior history of heart failure. He states that he had a recent echocardiogram done but does not recall the exact results, but he feels like he is told that everything was "medium." Patient required BiPAP for some period of time in the ED due to low oxygen saturations, but he was able to wean off the BiPAP in the ED. Hospitalists were called for admission  PAST MEDICAL HISTORY:   Past Medical History:  Diagnosis Date  . BPH (benign prostatic hyperplasia)   . COPD (chronic obstructive pulmonary disease) ([place])   . Elevated PSA   . Encounter for removal of urinary catheter   . GERD (gastroesophageal reflux disease)   . Gross hematuria   . Heart murmur   . Hyperlipidemia   . Hypertension   . Incomplete bladder emptying   . Kidney filling defect   . Paraphimosis   . Primary cancer of left renal pelvis ([place])   . Stroke ([place]) 2010  . Urinary tract infection     PAST SURGICAL HISTORY:   Past Surgical History:  Procedure Laterality Date  . APPENDECTOMY    . BACK SURGERY     lower back. patient unsure if there is metal in back  . CATARACT EXTRACTION W/ INTRAOCULAR LENS  IMPLANT, BILATERAL Bilateral   . COCHLEAR IMPLANT Left    middle ear implant  . CYSTOSCOPY N/A 04/07/2016   Procedure: Fulgeration and evacuation of  clot;  Surgeon: [name], MD;  Location: [facility] ORS;  Service: Urology;  Laterality: N/A;  . CYSTOSCOPY W/ RETROGRADES Right 04/07/2016   Procedure: CYSTOSCOPY WITH RETROGRADE PYELOGRAM;  Surgeon: [name], MD;  Location: [facility] ORS;  Service: Urology;  Laterality: Right;  . CYSTOSCOPY W/ URETERAL STENT PLACEMENT Right 11/04/2016   Procedure: CYSTOSCOPY WITH RETROGRADE PYELOGRAM/URETERAL STENT PLACEMENT;  Surgeon: [name], MD;  Location: [facility] ORS;  Service: Urology;  Laterality: Right;  . EAR MASTOIDECTOMY W/ COCHLEAR IMPLANT W/ LANDMARK    . ELBOW SURGERY Left    ball and joint replaced  . FOOT SURGERY Left 1980   lawn mower accident  . HOLEP-LASER ENUCLEATION OF THE PROSTATE WITH MORCELLATION N/A 04/07/2016   Procedure: HOLEP-LASER ENUCLEATION OF THE PROSTATE WITH MORCELLATION/ TURBT;  Surgeon: [name], MD;  Location: [facility] ORS;  Service: Urology;  Laterality: N/A;  . TONSILLECTOMY    . TOTAL KNEE ARTHROPLASTY Left   . TOTAL NEPHRECTOMY Left 11/2013  . TRANSURETHRAL RESECTION OF BLADDER TUMOR WITH MITOMYCIN-C N/A 11/04/2016   Procedure: TRANSURETHRAL RESECTION OF BLADDER TUMOR WITH MITOMYCIN-C;  Surgeon: [name], MD;  Location: [facility] ORS;  Service: Urology;  Laterality: N/A;  . URETEROSCOPY Right 11/04/2016   Procedure: URETEROSCOPY;  Surgeon: [name], MD;  Location: [facility] ORS;  Service: Urology;  Laterality: Right;    SOCIAL HISTORY:   Social History  Substance  Use Topics  . Smoking status: Current Every Day Smoker    Types: Pipe  . Smokeless tobacco: Never Used     Comment: no other passive smokers in home  . Alcohol use No    FAMILY HISTORY:   Family History  Problem Relation Age of Onset  . Brain cancer Father   . Lung cancer Father   . Kidney disease Neg Hx   . Prostate cancer Neg Hx     DRUG ALLERGIES:  No Known Allergies  MEDICATIONS AT HOME:   Prior to Admission medications   Medication Sig Start Date End Date Taking? Authorizing  Provider  albuterol (PROAIR HFA) 108 (90 Base) MCG/ACT inhaler Inhale 1-2 puffs into the lungs every 4 (four) hours as needed.  07/07/13  Yes Historical Provider, MD  albuterol (PROVENTIL) (2.5 MG/3ML) 0.083% nebulizer solution Take 2.5 mg by nebulization every 4 (four) hours as needed for wheezing or shortness of breath.   Yes Historical Provider, MD  amLODipine (NORVASC) 5 MG tablet Take 5 mg by mouth at bedtime.    Yes Historical Provider, MD  aspirin 81 MG tablet Take 81 mg by mouth daily.   Yes Historical Provider, MD  atorvastatin (LIPITOR) 40 MG tablet Take 40 mg by mouth every evening.   Yes Historical Provider, MD  carvedilol (COREG) 3.125 MG tablet Take 1 tablet by mouth 2 (two) times daily. 09/02/16  Yes Historical Provider, MD  ferrous sulfate 324 (65 FE) MG TBEC Take 1 tablet by mouth daily.   Yes Historical Provider, MD  Fluticasone-Salmeterol (ADVAIR) 250-50 MCG/DOSE AEPB Inhale 1 puff into the lungs 2 (two) times daily.   Yes Historical Provider, MD  gabapentin (NEURONTIN) 300 MG capsule Take 300 mg by mouth at bedtime. Reported on 03/06/2016   Yes Historical Provider, MD  HYDROcodone-acetaminophen (NORCO/VICODIN) 5-325 MG tablet Take 1-2 tablets by mouth every 6 (six) hours as needed for moderate pain. 11/04/16  Yes [name], MD  lisinopril (PRINIVIL,ZESTRIL) 10 MG tablet Take 10 mg by mouth daily.   Yes Historical Provider, MD  Multiple Vitamins-Minerals (EYE HEALTH) CAPS Take 1 capsule by mouth daily.    Yes Historical Provider, MD  pravastatin (PRAVACHOL) 40 MG tablet Take 40 mg by mouth at bedtime.    Yes Historical Provider, MD  ranitidine (ZANTAC) 75 MG tablet Take 75 mg by mouth 2 (two) times daily as needed. Reported on 03/06/2016   Yes Historical Provider, MD  rOPINIRole (REQUIP) 1 MG tablet Take 1 mg by mouth at bedtime.  04/07/13  Yes Historical Provider, MD  triamcinolone cream (KENALOG) 0.1 % Apply 1 application topically 2 (two) times daily as needed.    Yes Historical  Provider, MD  finasteride (PROSCAR) 5 MG tablet Take 1 tablet (5 mg total) by mouth daily. Patient not taking: Reported on 11/04/2016 03/06/16   [name], PA-C  ipratropium-albuterol (DUONEB) 0.5-2.5 (3) MG/3ML SOLN Take 3 mLs by nebulization every 4 (four) hours as needed. Patient not taking: Reported on 12/05/2016 07/09/15   Arlis Porta., MD    REVIEW OF SYSTEMS:  Review of Systems  Constitutional: Negative for chills, fever, malaise/fatigue and weight loss.  HENT: Negative for ear pain, hearing loss and tinnitus.   Eyes: Negative for blurred vision, double vision, pain and redness.  Respiratory: Positive for cough, sputum production, shortness of breath and wheezing. Negative for hemoptysis.   Cardiovascular: Negative for chest pain, palpitations, orthopnea and leg swelling.  Gastrointestinal: Negative for abdominal pain, constipation, diarrhea, nausea  and vomiting.  Genitourinary: Negative for dysuria, frequency and hematuria.  Musculoskeletal: Negative for back pain, joint pain and neck pain.  Skin:       No acne, rash, or lesions  Neurological: Negative for dizziness, tremors, focal weakness and weakness.  Endo/Heme/Allergies: Negative for polydipsia. Does not bruise/bleed easily.  Psychiatric/Behavioral: Negative for depression. The patient is not nervous/anxious and does not have insomnia.      VITAL SIGNS:   Vitals:   12/05/16 2300 12/06/16 0000 12/06/16 0030 12/06/16 0100  BP: 122/68 107/70 133/75 137/78  Pulse: 77 74 84 87  Resp: (!) 22 20 18  (!) 22  SpO2: 96% 93% 94% 93%  Weight:      Height:       Wt Readings from Last 3 Encounters:  12/05/16 63.5 kg (140 lb)  11/04/16 66.2 kg (146 lb)  10/23/16 67.1 kg (148 lb)    PHYSICAL EXAMINATION:  Physical Exam  Vitals reviewed. Constitutional: He is oriented to person, place, and time. He appears well-developed and well-nourished. No distress.  HENT:  Head: Normocephalic and atraumatic.  Mouth/Throat:  Oropharynx is clear and moist.  Eyes: Conjunctivae and EOM are normal. Pupils are equal, round, and reactive to light. No scleral icterus.  Neck: Normal range of motion. Neck supple. No JVD present. No thyromegaly present.  Cardiovascular: Normal rate, regular rhythm and intact distal pulses.  Exam reveals no gallop and no friction rub.   No murmur heard. Respiratory: He is in respiratory distress (Mild). He has wheezes. He has rales.  GI: Soft. Bowel sounds are normal. He exhibits no distension. There is no tenderness.  Musculoskeletal: Normal range of motion. He exhibits no edema.  No arthritis, no gout  Lymphadenopathy:    He has no cervical adenopathy.  Neurological: He is alert and oriented to person, place, and time. No cranial nerve deficit.  No dysarthria, no aphasia  Skin: Skin is warm and dry. No rash noted. No erythema.  Psychiatric: He has a normal mood and affect. His behavior is normal. Judgment and thought content normal.    LABORATORY PANEL:   CBC  Recent Labs Lab 12/05/16 2200  WBC 6.6  HGB 15.7  HCT 47.3  PLT 129*   ------------------------------------------------------------------------------------------------------------------  Chemistries   Recent Labs Lab 12/05/16 2200  NA 140  K 4.8  CL 108  CO2 23  GLUCOSE 183*  BUN 23*  CREATININE 1.32*  CALCIUM 9.3  AST 176*  ALT 111*  ALKPHOS 91  BILITOT 1.0   ------------------------------------------------------------------------------------------------------------------  Cardiac Enzymes  Recent Labs Lab 12/05/16 2200  TROPONINI 0.11*   ------------------------------------------------------------------------------------------------------------------  RADIOLOGY:  Dg Chest Portable 1 View  Result Date: 12/05/2016 CLINICAL DATA:  Progressive shortness of breath for 2 days. EXAM: PORTABLE CHEST 1 VIEW COMPARISON:  Chest radiographs 05/22/2012, chest CT 11/13/2014 FINDINGS: The heart is enlarged,  there is atherosclerosis of the thoracic aorta. Emphysema is again seen. Slightly more prominent interstitial coarsening than on prior exam. No evidence of pleural fluid. No pneumothorax. No confluent airspace disease. Some of the small pulmonary nodules on prior CT are identified in the right upper lung. No acute osseous abnormality is seen. IMPRESSION: 1. Cardiomegaly with atherosclerosis of the thoracic aorta. 2. Emphysema. There is progressive interstitial coarsening from prior radiograph, which may simply be emphysema, however superimposed pulmonary edema is difficult to exclude. Electronically Signed   By: Jeb Levering M.D.   On: 12/05/2016 22:37    EKG:   Orders placed or performed during the hospital encounter  of 12/05/16  . EKG 12-Lead  . EKG 12-Lead    IMPRESSION AND PLAN:  Principal Problem:   COPD exacerbation (HCC) - IV Solu-Medrol, azithromycin, when necessary nebs and antitussive Active Problems:   Acute systolic CHF (congestive heart failure) (HCC) - we'll trend troponins as his initial troponin was mildly positive at 0.11. Patient denies any chest pain or other cardiac symptoms. We'll get an echocardiogram in the morning and a cardiology consult. He got 1 dose of IV Lasix in the ED, we'll repeat this   HTN (hypertension) - continue home meds   HLD (hyperlipidemia) - home dose statin   GERD (gastroesophageal reflux disease) - home dose PPI  All the records are reviewed and case discussed with ED provider. Management plans discussed with the patient and/or family.  DVT PROPHYLAXIS: SubQ lovenox  GI PROPHYLAXIS: PPI  ADMISSION STATUS: Inpatient  CODE STATUS: Full Code Status History    Date Active Date Inactive Code Status Order ID Comments User Context   04/07/2016  6:55 PM 04/08/2016  9:03 PM Full Code [id number]  [name], MD Inpatient      TOTAL TIME TAKING CARE OF THIS PATIENT: 45 minutes.    Mackinsey Pelland [place] 12/06/2016, 1:27 AM  [facility]  [facility]ospitalists  Office  [phone]  CC: Primary care physician; [name], MD

## 2016-12-06 NOTE — ED Notes (Signed)
Spoke with MD Marcille Blanco about lasix order. MD ordered medication to be rescheduled  Until 0500. RN placed request with pharmacy for medication to be rescheduled

## 2016-12-06 NOTE — Progress Notes (Signed)
Eric Mullins is a 81 y.o. male  [id number]  Primary Cardiologist: Neoma Laming Reason for Consultation: CHF  HPI: This is a 81 year old white male with a past medical history of hypertension cardiomyopathy presented to the hospital with severe shortness breath orthopnea PND and leg swelling. Patient was in respiratory distress when he came in and was placed on BiPAP in the ER. Patient is feeling to some extent better but states that he couldn't even walk to the bathroom as he would get short of breath and had orthopnea PND along with leg swelling.   Review of Systems: No chest pain   Past Medical History:  Diagnosis Date  . BPH (benign prostatic hyperplasia)   . COPD (chronic obstructive pulmonary disease) ([place])   . Elevated PSA   . Encounter for removal of urinary catheter   . GERD (gastroesophageal reflux disease)   . Gross hematuria   . Heart murmur   . Hyperlipidemia   . Hypertension   . Incomplete bladder emptying   . Kidney filling defect   . Paraphimosis   . Primary cancer of left renal pelvis ([place])   . Stroke ([place]) 2010  . Urinary tract infection     Medications Prior to Admission  Medication Sig Dispense Refill  . albuterol (PROAIR HFA) 108 (90 Base) MCG/ACT inhaler Inhale 1-2 puffs into the lungs every 4 (four) hours as needed.     Marland Kitchen albuterol (PROVENTIL) (2.5 MG/3ML) 0.083% nebulizer solution Take 2.5 mg by nebulization every 4 (four) hours as needed for wheezing or shortness of breath.    Marland Kitchen amLODipine (NORVASC) 5 MG tablet Take 5 mg by mouth at bedtime.     Marland Kitchen aspirin 81 MG tablet Take 81 mg by mouth daily.    Marland Kitchen atorvastatin (LIPITOR) 40 MG tablet Take 40 mg by mouth every evening.    . ferrous sulfate 324 (65 FE) MG TBEC Take 1 tablet by mouth daily.    . Fluticasone-Salmeterol (ADVAIR) 250-50 MCG/DOSE AEPB Inhale 1 puff into the lungs 2 (two) times daily.    Marland Kitchen gabapentin (NEURONTIN) 300 MG capsule Take 300 mg by mouth at bedtime. Reported on 03/06/2016     . HYDROcodone-acetaminophen (NORCO/VICODIN) 5-325 MG tablet Take 1-2 tablets by mouth every 6 (six) hours as needed for moderate pain. 10 tablet 0  . lisinopril (PRINIVIL,ZESTRIL) 10 MG tablet Take 10 mg by mouth daily.    . Multiple Vitamins-Minerals (EYE HEALTH) CAPS Take 1 capsule by mouth daily.     . pravastatin (PRAVACHOL) 40 MG tablet Take 40 mg by mouth at bedtime.     . ranitidine (ZANTAC) 75 MG tablet Take 75 mg by mouth 2 (two) times daily as needed. Reported on 03/06/2016    . rOPINIRole (REQUIP) 1 MG tablet Take 1 mg by mouth at bedtime.     . triamcinolone cream (KENALOG) 0.1 % Apply 1 application topically 2 (two) times daily as needed.     . carvedilol (COREG) 3.125 MG tablet Take 1 tablet by mouth 2 (two) times daily.    . finasteride (PROSCAR) 5 MG tablet Take 1 tablet (5 mg total) by mouth daily. (Patient not taking: Reported on 11/04/2016) 90 tablet 3  . ipratropium-albuterol (DUONEB) 0.5-2.5 (3) MG/3ML SOLN Take 3 mLs by nebulization every 4 (four) hours as needed. (Patient not taking: Reported on 12/05/2016) 360 mL 12     . amLODipine  5 mg Oral QHS  . aspirin  81 mg Oral Daily  . atorvastatin  40 mg Oral QPM  . azithromycin  500 mg Intravenous Q24H  . carvedilol  3.125 mg Oral BID  . enoxaparin (LOVENOX) injection  40 mg Subcutaneous Q24H  . lisinopril  10 mg Oral Daily  . methylPREDNISolone (SOLU-MEDROL) injection  60 mg Intravenous Q6H  . mometasone-formoterol  2 puff Inhalation BID  . rOPINIRole  1 mg Oral QHS  . sodium chloride flush  3 mL Intravenous Q12H    Infusions:   No Known Allergies  Social History   Social History  . Marital status: Married    Spouse name: N/A  . Number of children: N/A  . Years of education: N/A   Occupational History  . Not on file.   Social History Main Topics  . Smoking status: Current Every Day Smoker    Types: Pipe  . Smokeless tobacco: Never Used     Comment: no other passive smokers in home  . Alcohol use No   . Drug use: No  . Sexual activity: Yes    Partners: Female   Other Topics Concern  . Not on file   Social History Narrative  . No narrative on file    Family History  Problem Relation Age of Onset  . Brain cancer Father   . Lung cancer Father   . Kidney disease Neg Hx   . Prostate cancer Neg Hx     PHYSICAL EXAM: Vitals:   12/06/16 0247 12/06/16 0725  BP: 125/68 135/65  Pulse: 77 98  Resp:    Temp:       Intake/Output Summary (Last 24 hours) at 12/06/16 1046 Last data filed at 12/06/16 0919  Gross per 24 hour  Intake                0 ml  Output             1810 ml  Net            -1810 ml    General:  Well appearing. No respiratory difficulty HEENT: normal Neck: supple. no JVD. Carotids 2+ bilat; no bruits. No lymphadenopathy or thryomegaly appreciated. Cor: PMI nondisplaced. Regular rate & rhythm. No rubs, gallops or murmurs. Lungs: clear Abdomen: soft, nontender, nondistended. No hepatosplenomegaly. No bruits or masses. Good bowel sounds. Extremities: no cyanosis, clubbing, rash, edema Neuro: alert & oriented x 3, cranial nerves grossly intact. moves all 4 extremities w/o difficulty. Affect pleasant.  [id number] tachycardia with frequent ectopy mostly atrial premature contractions with nonspecific ST-T changes poor hour progression suggestive of anteroseptal wall MI  Results for orders placed or performed during the hospital encounter of 12/05/16 (from the past 24 hour(s))  CBC     Status: Abnormal   Collection Time: 12/05/16 10:00 PM  Result Value Ref Range   WBC 6.6 3.8 - 10.6 K/uL   RBC 5.15 4.40 - 5.90 MIL/uL   Hemoglobin 15.7 13.0 - 18.0 g/dL   HCT 47.3 40.0 - 52.0 %   MCV 91.7 80.0 - 100.0 fL   MCH 30.4 26.0 - 34.0 pg   MCHC 33.2 32.0 - 36.0 g/dL   RDW 14.6 (H) 11.5 - 14.5 %   Platelets 129 (L) 150 - 440 K/uL  Comprehensive metabolic panel     Status: Abnormal   Collection Time: 12/05/16 10:00 PM  Result Value Ref Range   Sodium 140 135 - 145  mmol/L   Potassium 4.8 3.5 - 5.1 mmol/L   Chloride 108 101 - 111 mmol/L   CO2 23  22 - 32 mmol/L   Glucose, Bld 183 (H) 65 - 99 mg/dL   BUN 23 (H) 6 - 20 mg/dL   Creatinine, Ser 1.32 (H) 0.61 - 1.24 mg/dL   Calcium 9.3 8.9 - 10.3 mg/dL   Total Protein 6.9 6.5 - 8.1 g/dL   Albumin 4.0 3.5 - 5.0 g/dL   AST 176 (H) 15 - 41 U/L   ALT 111 (H) 17 - 63 U/L   Alkaline Phosphatase 91 38 - 126 U/L   Total Bilirubin 1.0 0.3 - 1.2 mg/dL   GFR calc non Af Amer 50 (L) >60 mL/min   GFR calc Af Amer 58 (L) >60 mL/min   Anion gap 9 5 - 15  Troponin I     Status: Abnormal   Collection Time: 12/05/16 10:00 PM  Result Value Ref Range   Troponin I 0.11 (HH) <0.03 ng/mL  Brain natriuretic peptide     Status: Abnormal   Collection Time: 12/05/16 10:00 PM  Result Value Ref Range   B Natriuretic Peptide 2,678.0 (H) 0.0 - 100.0 pg/mL  Troponin I     Status: Abnormal   Collection Time: 12/06/16  2:59 AM  Result Value Ref Range   Troponin I 0.10 (HH) <0.03 ng/mL  Basic metabolic panel     Status: Abnormal   Collection Time: 12/06/16  2:59 AM  Result Value Ref Range   Sodium 141 135 - 145 mmol/L   Potassium 4.7 3.5 - 5.1 mmol/L   Chloride 109 101 - 111 mmol/L   CO2 22 22 - 32 mmol/L   Glucose, Bld 111 (H) 65 - 99 mg/dL   BUN 26 (H) 6 - 20 mg/dL   Creatinine, Ser 1.31 (H) 0.61 - 1.24 mg/dL   Calcium 9.5 8.9 - 10.3 mg/dL   GFR calc non Af Amer 50 (L) >60 mL/min   GFR calc Af Amer 58 (L) >60 mL/min   Anion gap 10 5 - 15  CBC     Status: Abnormal   Collection Time: 12/06/16  2:59 AM  Result Value Ref Range   WBC 5.7 3.8 - 10.6 K/uL   RBC 5.25 4.40 - 5.90 MIL/uL   Hemoglobin 16.0 13.0 - 18.0 g/dL   HCT 47.1 40.0 - 52.0 %   MCV 89.7 80.0 - 100.0 fL   MCH 30.5 26.0 - 34.0 pg   MCHC 34.0 32.0 - 36.0 g/dL   RDW 14.6 (H) 11.5 - 14.5 %   Platelets 135 (L) 150 - 440 K/uL  Troponin I     Status: Abnormal   Collection Time: 12/06/16  8:35 AM  Result Value Ref Range   Troponin I 0.08 (HH) <0.03 ng/mL    Dg Chest Portable 1 View  Result Date: 12/05/2016 CLINICAL DATA:  Progressive shortness of breath for 2 days. EXAM: PORTABLE CHEST 1 VIEW COMPARISON:  Chest radiographs 05/22/2012, chest CT 11/13/2014 FINDINGS: The heart is enlarged, there is atherosclerosis of the thoracic aorta. Emphysema is again seen. Slightly more prominent interstitial coarsening than on prior exam. No evidence of pleural fluid. No pneumothorax. No confluent airspace disease. Some of the small pulmonary nodules on prior CT are identified in the right upper lung. No acute osseous abnormality is seen. IMPRESSION: 1. Cardiomegaly with atherosclerosis of the thoracic aorta. 2. Emphysema. There is progressive interstitial coarsening from prior radiograph, which may simply be emphysema, however superimposed pulmonary edema is difficult to exclude. Electronically Signed   By: Jeb Levering M.D.   On: 12/05/2016 22:37  ASSESSMENT AND PLAN: Congestive heart failure with echocardiogram showing four-chamber dilated patient and severe left ventricle systolic dysfunction with left ventricular ejection fraction 20% with diffuse hypokinesis and grade 1 diastolic dysfunction. Advise starting the patient on the side Lasix Entresto, Coreg, Aldactone and digoxin.  Koralynn Greenspan A

## 2016-12-07 MED ORDER — FERROUS SULFATE 325 (65 FE) MG PO TABS
325.0000 mg | ORAL_TABLET | Freq: Every day | ORAL | Status: DC
  Administered 2016-12-07 – 2016-12-08 (×2): 325 mg via ORAL
  Filled 2016-12-07 (×2): qty 1

## 2016-12-07 MED ORDER — AZITHROMYCIN 500 MG PO TABS
500.0000 mg | ORAL_TABLET | Freq: Every day | ORAL | Status: DC
  Administered 2016-12-08: 500 mg via ORAL
  Filled 2016-12-07: qty 1

## 2016-12-07 MED ORDER — PREDNISONE 50 MG PO TABS
50.0000 mg | ORAL_TABLET | Freq: Every day | ORAL | Status: DC
  Administered 2016-12-08: 50 mg via ORAL
  Filled 2016-12-07: qty 1

## 2016-12-07 NOTE — Progress Notes (Signed)
PHARMACIST - PHYSICIAN COMMUNICATION CONCERNING: Antibiotic IV to Oral Route Change Policy  RECOMMENDATION: This patient is receiving Azithromycin by the intravenous route.  Based on criteria approved by the Pharmacy and Therapeutics Committee, the antibiotic(s) is/are being converted to the equivalent oral dose form(s).   DESCRIPTION: These criteria include:  Patient being treated for a respiratory tract infection, urinary tract infection, cellulitis or clostridium difficile associated diarrhea if on metronidazole  The patient is not neutropenic and does not exhibit a GI malabsorption state  The patient is eating (either orally or via tube) and/or has been taking other orally administered medications for a least 24 hours  The patient is improving clinically and has a Tmax < 100.5   Eric Mullins, RPH 12/07/2016  

## 2016-12-07 NOTE — Progress Notes (Signed)
Pt complains of having difficulty breathing. Lungs show diminished bilateral, oxygen sat is 91% on 3L per [place]. PRN SVN given. I will continue to assess.

## 2016-12-07 NOTE — Progress Notes (Addendum)
SUBJECTIVE: Patient is feeling much better   Vitals:   12/06/16 2003 12/06/16 2013 12/06/16 2128 12/07/16 0421  BP: (!) 94/47 106/64  (!) 105/57  Pulse: 90 94  95  Resp:  18  18  Temp: 97.6 F (36.4 C) 97.8 F (36.6 C)  97.7 F (36.5 C)  TempSrc: Oral Oral  Oral  SpO2: 93% 92% 93% 93%  Weight:    142 lb 8 oz (64.6 kg)  Height:        Intake/Output Summary (Last 24 hours) at 12/07/16 1056 Last data filed at 12/07/16 1012  Gross per 24 hour  Intake             1460 ml  Output             1745 ml  Net             -285 ml    LABS: Basic Metabolic Panel:  Recent Labs  12/05/16 2200 12/06/16 0259  NA 140 141  K 4.8 4.7  CL 108 109  CO2 23 22  GLUCOSE 183* 111*  BUN 23* 26*  CREATININE 1.32* 1.31*  CALCIUM 9.3 9.5   Liver Function Tests:  Recent Labs  12/05/16 2200  AST 176*  ALT 111*  ALKPHOS 91  BILITOT 1.0  PROT 6.9  ALBUMIN 4.0   No results for input(s): LIPASE, AMYLASE in the last 72 hours. CBC:  Recent Labs  12/05/16 2200 12/06/16 0259  WBC 6.6 5.7  HGB 15.7 16.0  HCT 47.3 47.1  MCV 91.7 89.7  PLT 129* 135*   Cardiac Enzymes:  Recent Labs  12/06/16 0259 12/06/16 0835 12/06/16 1505  TROPONINI 0.10* 0.08* 0.06*   BNP: Invalid input(s): POCBNP D-Dimer: No results for input(s): DDIMER in the last 72 hours. Hemoglobin A1C: No results for input(s): HGBA1C in the last 72 hours. Fasting Lipid Panel: No results for input(s): CHOL, HDL, LDLCALC, TRIG, CHOLHDL, LDLDIRECT in the last 72 hours. Thyroid Function Tests: No results for input(s): TSH, T4TOTAL, T3FREE, THYROIDAB in the last 72 hours.  Invalid input(s): FREET3 Anemia Panel: No results for input(s): VITAMINB12, FOLATE, FERRITIN, TIBC, IRON, RETICCTPCT in the last 72 hours.   PHYSICAL EXAM General: Well developed, well nourished, in no acute distress HEENT:  Normocephalic and atramatic Neck:  No JVD.  Lungs: Clear bilaterally to auscultation and percussion. Heart: HRRR .  Normal S1 and S2 without gallops or murmurs.  Abdomen: Bowel sounds are positive, abdomen soft and non-tender  Msk:  Back normal, normal gait. Normal strength and tone for age. Extremities: No clubbing, cyanosis or edema.   Neuro: Alert and oriented X 3. Psych:  Good affect, responds appropriately  TELEMETRY:Sinus rhythm  ASSESSMENT AND PLAN: Cardiomyopathy with left ventricle ejection fraction 20% with acute exacerbation of congestive heart failure, patient was started on Entresto Coreg and Aldactone and is to some extent better but still coughing. Explained to the wife severity  of the situation and will continue these medicines and was scheduled symptoms improved condition discharge with close follow-up in the office.  Principal Problem:   COPD exacerbation ([place]) Active Problems:   HTN (hypertension)   HLD (hyperlipidemia)   GERD (gastroesophageal reflux disease)   Acute systolic CHF (congestive heart failure) ([facility])    Eric Mullins A, MD, [facility] 12/07/2016 10:56 AM

## 2016-12-07 NOTE — Progress Notes (Signed)
[facility] at [facility]                                                                                                                                                                                  Patient Demographics   Eric Mullins, is a 81 y.o. male, DOB - Jul 07, 1935, [id number]  Admit date - 12/05/2016   Admitting Physician Lance Coon, MD  Outpatient Primary MD for the patient is [name], MD   LOS - 1  Subjective: Patient's breathing continues to improve. Denies any chest pains or palpitations.  Review of Systems:   CONSTITUTIONAL: No documented fever. No fatigue, weakness. No weight gain, no weight loss.  EYES: No blurry or double vision.  ENT: No tinnitus. No postnasal drip. No redness of the oropharynx.  RESPIRATORY: Positive cough, no wheeze, no hemoptysis. Positive dyspnea.  CARDIOVASCULAR: No chest pain. No orthopnea. No palpitations. No syncope.  GASTROINTESTINAL: No nausea, no vomiting or diarrhea. No abdominal pain. No melena or hematochezia.  GENITOURINARY: No dysuria or hematuria.  ENDOCRINE: No polyuria or nocturia. No heat or cold intolerance.  HEMATOLOGY: No anemia. No bruising. No bleeding.  INTEGUMENTARY: No rashes. No lesions.  MUSCULOSKELETAL: No arthritis. No swelling. No gout.  NEUROLOGIC: No numbness, tingling, or ataxia. No seizure-type activity.  PSYCHIATRIC: No anxiety. No insomnia. No ADD.    Vitals:   Vitals:   12/06/16 2013 12/06/16 2128 12/07/16 0421 12/07/16 1122  BP: 106/64  (!) 105/57 128/67  Pulse: 94  95 84  Resp: 18  18 12   Temp: 97.8 F (36.6 C)  97.7 F (36.5 C)   TempSrc: Oral  Oral   SpO2: 92% 93% 93% 93%  Weight:   142 lb 8 oz (64.6 kg)   Height:        Wt Readings from Last 3 Encounters:  12/07/16 142 lb 8 oz (64.6 kg)  11/04/16 146 lb (66.2 kg)  10/23/16 148 lb (67.1 kg)     Intake/Output Summary (Last 24 hours) at 12/07/16 1330 Last data filed at 12/07/16 1012   Gross per 24 hour  Intake             1460 ml  Output             1745 ml  Net             -285 ml    Physical Exam:   GENERAL: Pleasant-appearing in no apparent distress.  HEAD, EYES, EARS, NOSE AND THROAT: Atraumatic, normocephalic. Extraocular muscles are intact. Pupils equal and reactive to light. Sclerae anicteric. No conjunctival injection. No oro-pharyngeal erythema.  NECK: Supple. There is no jugular venous distention.  No bruits, no lymphadenopathy, no thyromegaly.  HEART: Regular rate and rhythm,. No murmurs, no rubs, no clicks.  LUNGS: Crackles at the bases. No accesory muscle use ABDOMEN: Soft, flat, nontender, nondistended. Has good bowel sounds. No hepatosplenomegaly appreciated.  EXTREMITIES: No evidence of any cyanosis, clubbing, or peripheral edema.  +2 pedal and radial pulses bilaterally.  NEUROLOGIC: The patient is alert, awake, and oriented x3 with no focal motor or sensory deficits appreciated bilaterally.  SKIN: Moist and warm with no rashes appreciated.  Psych: Not anxious, depressed LN: No inguinal LN enlargement    Antibiotics   Anti-infectives    Start     Dose/Rate Route Frequency Ordered Stop   12/08/16 1000  azithromycin (ZITHROMAX) tablet 500 mg     500 mg Oral Daily 12/07/16 0816     12/06/16 0245  azithromycin (ZITHROMAX) 500 mg in dextrose 5 % 250 mL IVPB  Status:  Discontinued     500 mg 250 mL/hr over 60 Minutes Intravenous Every 24 hours 12/06/16 0244 12/07/16 0816      Medications   Scheduled Meds: . aspirin  81 mg Oral Daily  . atorvastatin  40 mg Oral QPM  . [START ON 12/08/2016] azithromycin  500 mg Oral Daily  . carvedilol  6.25 mg Oral BID  . digoxin  0.125 mg Oral Daily  . enoxaparin (LOVENOX) injection  40 mg Subcutaneous Q24H  . ferrous sulfate  325 mg Oral Q breakfast  . furosemide  40 mg Intravenous Q12H  . methylPREDNISolone (SOLU-MEDROL) injection  60 mg Intravenous Q6H  . mometasone-formoterol  2 puff Inhalation BID  .  rOPINIRole  1 mg Oral QHS  . sacubitril-valsartan  1 tablet Oral BID  . sodium chloride flush  3 mL Intravenous Q12H  . spironolactone  25 mg Oral Daily   Continuous Infusions: PRN Meds:.acetaminophen **OR** acetaminophen, alum & mag hydroxide-simeth, guaiFENesin-dextromethorphan, HYDROcodone-acetaminophen, ipratropium-albuterol, ondansetron **OR** ondansetron (ZOFRAN) IV   Data Review:   Micro Results No results found for this or any previous visit (from the past 240 hour(s)).  Radiology Reports Dg Chest Portable 1 View  Result Date: 12/05/2016 CLINICAL DATA:  Progressive shortness of breath for 2 days. EXAM: PORTABLE CHEST 1 VIEW COMPARISON:  Chest radiographs 05/22/2012, chest CT 11/13/2014 FINDINGS: The heart is enlarged, there is atherosclerosis of the thoracic aorta. Emphysema is again seen. Slightly more prominent interstitial coarsening than on prior exam. No evidence of pleural fluid. No pneumothorax. No confluent airspace disease. Some of the small pulmonary nodules on prior CT are identified in the right upper lung. No acute osseous abnormality is seen. IMPRESSION: 1. Cardiomegaly with atherosclerosis of the thoracic aorta. 2. Emphysema. There is progressive interstitial coarsening from prior radiograph, which may simply be emphysema, however superimposed pulmonary edema is difficult to exclude. Electronically Signed   By: Jeb Levering M.D.   On: 12/05/2016 22:37     CBC  Recent Labs Lab 12/05/16 2200 12/06/16 0259  WBC 6.6 5.7  HGB 15.7 16.0  HCT 47.3 47.1  PLT 129* 135*  MCV 91.7 89.7  MCH 30.4 30.5  MCHC 33.2 34.0  RDW 14.6* 14.6*    Chemistries   Recent Labs Lab 12/05/16 2200 12/06/16 0259  NA 140 141  K 4.8 4.7  CL 108 109  CO2 23 22  GLUCOSE 183* 111*  BUN 23* 26*  CREATININE 1.32* 1.31*  CALCIUM 9.3 9.5  AST 176*  --   ALT 111*  --   ALKPHOS 91  --  BILITOT 1.0  --     ------------------------------------------------------------------------------------------------------------------ estimated creatinine clearance is 41.8 mL/min (by C-G formula based on SCr of 1.31 mg/dL (H)). ------------------------------------------------------------------------------------------------------------------ No results for input(s): HGBA1C in the last 72 hours. ------------------------------------------------------------------------------------------------------------------ No results for input(s): CHOL, HDL, LDLCALC, TRIG, CHOLHDL, LDLDIRECT in the last 72 hours. ------------------------------------------------------------------------------------------------------------------ No results for input(s): TSH, T4TOTAL, T3FREE, THYROIDAB in the last 72 hours.  Invalid input(s): FREET3 ------------------------------------------------------------------------------------------------------------------ No results for input(s): VITAMINB12, FOLATE, FERRITIN, TIBC, IRON, RETICCTPCT in the last 72 hours.  Coagulation profile No results for input(s): INR, PROTIME in the last 168 hours.  No results for input(s): DDIMER in the last 72 hours.  Cardiac Enzymes  Recent Labs Lab 12/06/16 0259 12/06/16 0835 12/06/16 1505  TROPONINI 0.10* 0.08* 0.06*   ------------------------------------------------------------------------------------------------------------------ Invalid input(s): POCBNP    Assessment & Plan   Pt is 81  With sob : 1. Acute on chronic COPD exacerbation ([facility])  Prednisone taper azithromycin, when necessary nebs and antitussive   2.  Acute systolic CHF (congestive heart failure) ([place]) - patient has diuresed well continue IV Lasix     continued therapy with Entresto 3. Essential HTN (hypertension) - continue home with Coreg and entresto 4. HLD (hyperlipidemia) - home dose statin 5.   GERD (gastroesophageal reflux disease) - home dose PPI     Code Status Orders         Start     Ordered   12/06/16 0245  Full code  Continuous     12/06/16 0244    Code Status History    Date Active Date Inactive Code Status Order ID Comments User Context   04/07/2016  6:55 PM 04/08/2016  9:03 PM Full Code [id number]  [name], MD Inpatient    Advance Directive Documentation   Flowsheet Row Most Recent Value  Type of Advance Directive  Healthcare Power of Attorney, Living will  Pre-existing out of facility DNR order (yellow form or pink MOST form)  No data  "MOST" Form in Place?  No data           Consults  cardiology   DVT Prophylaxis  Lovenox    Lab Results  Component Value Date   PLT 135 (L) 12/06/2016     Time Spent in minutes  17min  Greater than 50% of time spent in care coordination and counseling patient regarding the condition and plan of care.   Dustin Flock M.D on 12/07/2016 at 1:30 PM  Between 7am to 6pm - Pager - [phone]  After 6pm go to [email or web address] - password [email or web address] [facility]   Office  [phone]

## 2016-12-07 NOTE — Progress Notes (Signed)
SATURATION QUALIFICATIONS: (This note is used to comply with regulatory documentation for home oxygen)  Patient Saturations on Room Air at Rest = 88%  Patient Saturations on Room Air while ambulating = n/a%  Patient Saturations on 3 Liters of oxygen while resting = 91%  Please briefly explain why patient needs home oxygen:

## 2016-12-08 DIAGNOSIS — E785 Hyperlipidemia, unspecified: Secondary | ICD-10-CM | POA: Diagnosis not present

## 2016-12-08 DIAGNOSIS — Z808 Family history of malignant neoplasm of other organs or systems: Secondary | ICD-10-CM | POA: Diagnosis not present

## 2016-12-08 DIAGNOSIS — N401 Enlarged prostate with lower urinary tract symptoms: Secondary | ICD-10-CM | POA: Diagnosis not present

## 2016-12-08 DIAGNOSIS — Z96652 Presence of left artificial knee joint: Secondary | ICD-10-CM | POA: Diagnosis not present

## 2016-12-08 DIAGNOSIS — I10 Essential (primary) hypertension: Secondary | ICD-10-CM | POA: Diagnosis not present

## 2016-12-08 DIAGNOSIS — F17290 Nicotine dependence, other tobacco product, uncomplicated: Secondary | ICD-10-CM | POA: Diagnosis not present

## 2016-12-08 DIAGNOSIS — Z8673 Personal history of transient ischemic attack (TIA), and cerebral infarction without residual deficits: Secondary | ICD-10-CM | POA: Diagnosis not present

## 2016-12-08 DIAGNOSIS — Z79899 Other long term (current) drug therapy: Secondary | ICD-10-CM | POA: Diagnosis not present

## 2016-12-08 DIAGNOSIS — Z9621 Cochlear implant status: Secondary | ICD-10-CM | POA: Diagnosis not present

## 2016-12-08 DIAGNOSIS — Z905 Acquired absence of kidney: Secondary | ICD-10-CM | POA: Diagnosis not present

## 2016-12-08 DIAGNOSIS — I110 Hypertensive heart disease with heart failure: Secondary | ICD-10-CM | POA: Diagnosis not present

## 2016-12-08 DIAGNOSIS — R0602 Shortness of breath: Secondary | ICD-10-CM | POA: Diagnosis not present

## 2016-12-08 DIAGNOSIS — J441 Chronic obstructive pulmonary disease with (acute) exacerbation: Secondary | ICD-10-CM | POA: Diagnosis not present

## 2016-12-08 DIAGNOSIS — R338 Other retention of urine: Secondary | ICD-10-CM | POA: Diagnosis not present

## 2016-12-08 DIAGNOSIS — K219 Gastro-esophageal reflux disease without esophagitis: Secondary | ICD-10-CM | POA: Diagnosis not present

## 2016-12-08 DIAGNOSIS — Z961 Presence of intraocular lens: Secondary | ICD-10-CM | POA: Diagnosis not present

## 2016-12-08 DIAGNOSIS — I43 Cardiomyopathy in diseases classified elsewhere: Secondary | ICD-10-CM | POA: Diagnosis not present

## 2016-12-08 DIAGNOSIS — Z7982 Long term (current) use of aspirin: Secondary | ICD-10-CM | POA: Diagnosis not present

## 2016-12-08 DIAGNOSIS — R0603 Acute respiratory distress: Secondary | ICD-10-CM | POA: Diagnosis not present

## 2016-12-08 DIAGNOSIS — I5021 Acute systolic (congestive) heart failure: Secondary | ICD-10-CM | POA: Diagnosis not present

## 2016-12-08 LAB — BASIC METABOLIC PANEL
Anion gap: 9 (ref 5–15)
BUN: 60 mg/dL — AB (ref 6–20)
CALCIUM: 8.9 mg/dL (ref 8.9–10.3)
CO2: 26 mmol/L (ref 22–32)
CREATININE: 1.65 mg/dL — AB (ref 0.61–1.24)
Chloride: 98 mmol/L — ABNORMAL LOW (ref 101–111)
GFR calc Af Amer: 44 mL/min — ABNORMAL LOW (ref >60)
GFR calc non Af Amer: 38 mL/min — ABNORMAL LOW (ref >60)
GLUCOSE: 121 mg/dL — AB (ref 65–99)
Potassium: 3.9 mmol/L (ref 3.5–5.1)
Sodium: 133 mmol/L — ABNORMAL LOW (ref 135–145)

## 2016-12-08 MED ORDER — SPIRONOLACTONE 25 MG PO TABS: 25.0000 mg | ORAL_TABLET | Freq: Every day | ORAL | 0 refills | Status: AC

## 2016-12-08 MED ORDER — DIGOXIN 125 MCG PO TABS: 0.1250 mg | ORAL_TABLET | Freq: Every day | ORAL | 0 refills | Status: AC

## 2016-12-08 MED ORDER — GUAIFENESIN-DM 100-10 MG/5ML PO SYRP: 5.0000 mL | ORAL_SOLUTION | ORAL | 0 refills | Status: DC | PRN

## 2016-12-08 MED ORDER — SACUBITRIL-VALSARTAN 24-26 MG PO TABS: 1.0000 | ORAL_TABLET | Freq: Two times a day (BID) | ORAL | 0 refills | Status: AC

## 2016-12-08 MED ORDER — FUROSEMIDE 40 MG PO TABS: 40.0000 mg | ORAL_TABLET | Freq: Two times a day (BID) | ORAL | 0 refills | Status: AC

## 2016-12-08 MED ORDER — PREDNISONE 10 MG (21) PO TBPK: 10.0000 mg | ORAL_TABLET | Freq: Every day | ORAL | 0 refills | Status: DC

## 2016-12-08 MED ORDER — SACUBITRIL-VALSARTAN 24-26 MG PO TABS: 1.0000 | ORAL_TABLET | Freq: Two times a day (BID) | ORAL | 0 refills | Status: DC

## 2016-12-08 MED ORDER — SPIRONOLACTONE 25 MG PO TABS: 25.0000 mg | ORAL_TABLET | Freq: Every day | ORAL | 0 refills | Status: DC

## 2016-12-08 MED ORDER — AZITHROMYCIN 500 MG PO TABS: 500.0000 mg | ORAL_TABLET | Freq: Every day | ORAL | 0 refills | Status: AC

## 2016-12-08 NOTE — Progress Notes (Signed)
Discharge instructions explained to pt and pts spouse/ verbalized an understanding/ iv and tele removed/ RX  Given to pt/ o2 tank delivered to pts room/ will transport off unit via wheelchair.

## 2016-12-08 NOTE — Evaluation (Signed)
Physical Therapy Evaluation Patient Details Name: Eric Mullins MRN: [id number] DOB: November 12, 1935 Today's Date: 12/08/2016   History of Present Illness  Patient is a 82 y/o male that presents with acute systolic CHF and acute COPd exacerbation (was noticing he was short of breath). Initiated on BiPap upon coming to ER.   Clinical Impression  Patient evaluated after having shortness of breath with limited mobility prior to admission. Patient noted to be breathing comfortably prior to ambulation, no DOE with ambulation. His O2 sats remained between 91-93% during ambulation, no loss of balance noted with RW. He did take very short step lengths, and required UEs for assistance to transfer sit to stand indicative of LE weakness. He would benefit from HHPT to address LE weakness and cardiopulmonary deficits after he is medically stable for discharge.     Follow Up Recommendations Home health PT    Equipment Recommendations  Rolling walker with 5" wheels    Recommendations for Other Services       Precautions / Restrictions Precautions Precautions: Fall Restrictions Weight Bearing Restrictions: No      Mobility  Bed Mobility Overal bed mobility: Modified Independent             General bed mobility comments: Patient is able to transfer from supine to sit with HOB elevated, no deficits noted.   Transfers Overall transfer level: Needs assistance Equipment used: Rolling walker (2 wheeled) Transfers: Sit to/from Stand Sit to Stand: Min guard         General transfer comment: Patient performs sit to stand slowly, no loss of balance noted but required use of UEs to complete indicating LE weakness.   Ambulation/Gait Ambulation/Gait assistance: Supervision Ambulation Distance (Feet): 200 Feet Assistive device: Rolling walker (2 wheeled) Gait Pattern/deviations: Decreased step length - right;Decreased step length - left;Trunk flexed   Gait velocity interpretation: <1.8 ft/sec,  indicative of risk for recurrent falls General Gait Details: Patient ambulates slowly with bilateral ER at his LEs. O2 sats on 3L remained between 91-93% HR below 90 bpm throughout ambulation. No loss of balance or shortness of breath reported during ambulation.   Stairs            Wheelchair Mobility    Modified Rankin (Stroke Patients Only)       Balance                                             Pertinent Vitals/Pain Pain Assessment: No/denies pain    Home Living Family/patient expects to be discharged to:: Private residence Living Arrangements: Spouse/significant other Available Help at Discharge: Family;Available 24 hours/day Type of Home: House Home Access: Stairs to enter   [employer] of Steps: 2-3 Home Layout: One level Home Equipment: Walker - 2 wheels;Wheelchair - power;Cane - quad      Prior Function Level of Independence: Independent with assistive device(s)         Comments: Patient typically uses quad cane at home, but does have a RW.      Hand Dominance        Extremity/Trunk Assessment   Upper Extremity Assessment Upper Extremity Assessment: Overall WFL for tasks assessed    Lower Extremity Assessment Lower Extremity Assessment: Overall WFL for tasks assessed       Communication   Communication: No difficulties  Cognition Arousal/Alertness: Awake/alert Behavior During Therapy: WFL for tasks assessed/performed Overall  Cognitive Status: Within Functional Limits for tasks assessed                      General Comments      Exercises     Assessment/Plan    PT Assessment Patient needs continued PT services  PT Problem List Decreased strength;Decreased balance;Decreased knowledge of use of DME;Decreased safety awareness;Cardiopulmonary status limiting activity;Decreased activity tolerance;Decreased mobility          PT Treatment Interventions DME instruction;Functional mobility  training;Therapeutic activities;Gait training;Stair training;Therapeutic exercise;Neuromuscular re-education;Balance training;Patient/family education    PT Goals (Current goals can be found in the Care Plan section)  Acute Rehab PT Goals Patient Stated Goal: To return home  PT Goal Formulation: With patient/family Time For Goal Achievement: 12/22/16 Potential to Achieve Goals: Good    Frequency Min 2X/week   Barriers to discharge        Co-evaluation               End of Session Equipment Utilized During Treatment: Gait belt;Oxygen Activity Tolerance: Patient tolerated treatment well Patient left: in bed;with call bell/phone within reach;with bed alarm set;with family/visitor present Nurse Communication: Mobility status         Time: [id number] PT Time Calculation (min) (ACUTE ONLY): 17 min   Charges:   PT Evaluation $PT Eval Moderate Complexity: 1 Procedure     PT G Codes:       Kerman Passey, PT, DPT    12/08/2016, 10:51 AM

## 2016-12-08 NOTE — Care Management (Signed)
Patient presents from home.  LIves with wife and uses cane but patient says he has access to a rolling walker.  Independent in all adls, denies issues accessing medical care, obtaining medications or with transportation.  Current with PCP.  In need of home health SN and PT and new home 02.  Agency preference is [facility].  Portable tank has been delivered to patient's room. Brad with [facility] is coordinating the home health services.

## 2016-12-08 NOTE — Progress Notes (Signed)
SATURATION QUALIFICATIONS: (This note is used to comply with regulatory documentation for home oxygen)  Patient Saturations on Room Air at Rest = 86%  Patient Saturations on Room Air while Ambulating = %  Patient Saturations on Liters of oxygen while Ambulating = %  Please briefly explain why patient needs home oxygen: hx copd/chf

## 2016-12-08 NOTE — Discharge Summary (Signed)
[place] at [facility], [place], 82 y.o., DOB 01-Apr-1935, MRN [id number]. Admission date: 12/05/2016 Discharge Date 12/08/2016 Primary MD [name], MD Admitting Physician Lance Coon, MD  Admission Diagnosis  Shortness of breath [R06.02] Acute pulmonary edema ([facility]) [J81.0] COPD exacerbation ([place]) [J44.1]  Discharge Diagnosis   Principal Problem:   Acute systolic CHF    Acute on chronic COPD exacerbation ([facility])   HTN (hypertension)   HLD (hyperlipidemia)   GERD (gastroesophageal reflux disease)   GERD   BPH      Hospital Course Zykeem Ifft  is a 82 y.o. male who presents with Shortness of breath and significant cough. Patient was not found to have pneumonia on evaluation here, but was found to have pulmonary edema as well as symptoms of COPD exacerbation. Patient underwent workup and had a echocardiogram which showed significant systolic dysfunction. He was diuresed with IV Lasix with significant improvement in his symptoms. He was seen by cardiology. They recommended close monitoring and outpatient follow-up. He was also treated for COPD exacerbation. He continues to have shortness of breath and is requiring home oxygen therapy.              Consults  cardiology  Significant Tests:  See full reports for all details    Dg Chest Portable 1 View  Result Date: 12/05/2016 CLINICAL DATA:  Progressive shortness of breath for 2 days. EXAM: PORTABLE CHEST 1 VIEW COMPARISON:  Chest radiographs 05/22/2012, chest CT 11/13/2014 FINDINGS: The heart is enlarged, there is atherosclerosis of the thoracic aorta. Emphysema is again seen. Slightly more prominent interstitial coarsening than on prior exam. No evidence of pleural fluid. No pneumothorax. No confluent airspace disease. Some of the small pulmonary nodules on prior CT are identified in the right upper lung. No acute osseous abnormality is seen. IMPRESSION: 1. Cardiomegaly with  atherosclerosis of the thoracic aorta. 2. Emphysema. There is progressive interstitial coarsening from prior radiograph, which may simply be emphysema, however superimposed pulmonary edema is difficult to exclude. Electronically Signed   By: Jeb Levering M.D.   On: 12/05/2016 22:37       Today   Subjective:   Flavel Costner  feeling much better shortness of breath improved.  Objective:   Blood pressure (!) 106/56, pulse 66, temperature 97.5 F (36.4 C), temperature source Oral, resp. rate 20, height 5\' 8"  (1.727 m), weight 141 lb 4.8 oz (64.1 kg), SpO2 92 %.  .  Intake/Output Summary (Last 24 hours) at 12/08/16 1608 Last data filed at 12/08/16 0939  Gross per 24 hour  Intake              730 ml  Output             2875 ml  Net            -2145 ml    Exam VITAL SIGNS: Blood pressure (!) 106/56, pulse 66, temperature 97.5 F (36.4 C), temperature source Oral, resp. rate 20, height 5\' 8"  (1.727 m), weight 141 lb 4.8 oz (64.1 kg), SpO2 92 %.  GENERAL:  82 y.o.-year-old patient lying in the bed with no acute distress.  EYES: Pupils equal, round, reactive to light and accommodation. No scleral icterus. Extraocular muscles intact.  HEENT: Head atraumatic, normocephalic. Oropharynx and nasopharynx clear.  NECK:  Supple, no jugular venous distention. No thyroid enlargement, no tenderness.  LUNGS: Normal breath sounds bilaterally, no wheezing, rales,rhonchi or crepitation. No use of accessory muscles of respiration.  CARDIOVASCULAR: S1, S2 normal. No murmurs, rubs, or gallops.  ABDOMEN: Soft, nontender, nondistended. Bowel sounds present. No organomegaly or mass.  EXTREMITIES: No pedal edema, cyanosis, or clubbing.  NEUROLOGIC: Cranial nerves II through XII are intact. Muscle strength 5/5 in all extremities. Sensation intact. Gait not checked.  PSYCHIATRIC: The patient is alert and oriented x 3.  SKIN: No obvious rash, lesion, or ulcer.   Data Review     CBC w Diff: Lab Results   Component Value Date   WBC 5.7 12/06/2016   HGB 16.0 12/06/2016   HGB 10.1 (L) 12/07/2014   HCT 47.1 12/06/2016   HCT 31.8 (L) 12/07/2014   PLT 135 (L) 12/06/2016   PLT 336 12/07/2014   LYMPHOPCT 11.1 12/07/2014   MONOPCT 6.8 12/07/2014   EOSPCT 7.5 12/07/2014   BASOPCT 0.8 12/07/2014   CMP: Lab Results  Component Value Date   NA 133 (L) 12/08/2016   NA 139 11/22/2014   K 3.9 12/08/2016   K 4.4 11/22/2014   CL 98 (L) 12/08/2016   CL 109 (H) 11/22/2014   CO2 26 12/08/2016   CO2 24 11/22/2014   BUN 60 (H) 12/08/2016   BUN 16 11/22/2014   CREATININE 1.65 (H) 12/08/2016   CREATININE 1.54 (H) 12/07/2014   PROT 6.9 12/05/2016   ALBUMIN 4.0 12/05/2016   BILITOT 1.0 12/05/2016   ALKPHOS 91 12/05/2016   AST 176 (H) 12/05/2016   ALT 111 (H) 12/05/2016  .  Micro Results No results found for this or any previous visit (from the past 240 hour(s)).   Code Status History    Date Active Date Inactive Code Status Order ID Comments User Context   12/06/2016  2:44 AM 12/08/2016  2:47 PM Full Code [id number]  Lance Coon, MD Inpatient   04/07/2016  6:55 PM 04/08/2016  9:03 PM Full Code [id number]  [name], MD Inpatient    Advance Directive Documentation   Flowsheet Row Most Recent Value  Type of Advance Directive  Healthcare Power of Attorney, Living will  Pre-existing out of facility DNR order (yellow form or pink MOST form)  No data  "MOST" Form in Place?  No data          Follow-up Information    [name], MD. Schedule an appointment as soon as possible for a visit in 7 day(s).   Specialty:  Internal Medicine Contact information: [place] [address] [phone]        Dionisio David, MD. Schedule an appointment as soon as possible for a visit in 4 day(s).   Specialty:  Cardiology Contact information: [place] [address] [place] [facility] up.   Why:  Home health nurse,  physical therapy and oxygen Contact information: [address] [place] [address] [phone]           Discharge Medications   Allergies as of 12/08/2016   No Known Allergies     Medication List    STOP taking these medications   amLODipine 5 MG tablet Commonly known as:  NORVASC   lisinopril 10 MG tablet Commonly known as:  PRINIVIL,ZESTRIL     TAKE these medications   aspirin 81 MG tablet Take 81 mg by mouth daily.   atorvastatin 40 MG tablet Commonly known as:  LIPITOR Take 40 mg by mouth every evening.   azithromycin 500 MG tablet Commonly known as:  ZITHROMAX Take 1 tablet (  500 mg total) by mouth daily. Start taking on:  12/09/2016   carvedilol 3.125 MG tablet Commonly known as:  COREG Take 1 tablet by mouth 2 (two) times daily.   digoxin 0.125 MG tablet Commonly known as:  LANOXIN Take 1 tablet (0.125 mg total) by mouth daily. Start taking on:  12/09/2016   EYE HEALTH Caps Take 1 capsule by mouth daily.   ferrous sulfate 324 (65 Fe) MG Tbec Take 1 tablet by mouth daily.   finasteride 5 MG tablet Commonly known as:  PROSCAR Take 1 tablet (5 mg total) by mouth daily.   Fluticasone-Salmeterol 250-50 MCG/DOSE Aepb Commonly known as:  ADVAIR Inhale 1 puff into the lungs 2 (two) times daily.   furosemide 40 MG tablet Commonly known as:  LASIX Take 1 tablet (40 mg total) by mouth 2 (two) times daily.   gabapentin 300 MG capsule Commonly known as:  NEURONTIN Take 300 mg by mouth at bedtime. Reported on 03/06/2016   guaiFENesin-dextromethorphan 100-10 MG/5ML syrup Commonly known as:  ROBITUSSIN DM Take 5 mLs by mouth every 4 (four) hours as needed for cough.   HYDROcodone-acetaminophen 5-325 MG tablet Commonly known as:  NORCO/VICODIN Take 1-2 tablets by mouth every 6 (six) hours as needed for moderate pain.   ipratropium-albuterol 0.5-2.5 (3) MG/3ML Soln Commonly known as:  DUONEB Take 3 mLs by nebulization every 4 (four) hours as  needed.   pravastatin 40 MG tablet Commonly known as:  PRAVACHOL Take 40 mg by mouth at bedtime.   predniSONE 10 MG (21) Tbpk tablet Commonly known as:  STERAPRED UNI-PAK 21 TAB Take 1 tablet (10 mg total) by mouth daily. Start at 60mg  taper by 10mg  until complete   PROAIR HFA 108 (90 Base) MCG/ACT inhaler Generic drug:  albuterol Inhale 1-2 puffs into the lungs every 4 (four) hours as needed. What changed:  Another medication with the same name was removed. Continue taking this medication, and follow the directions you see here.   ranitidine 75 MG tablet Commonly known as:  ZANTAC Take 75 mg by mouth 2 (two) times daily as needed. Reported on 03/06/2016   rOPINIRole 1 MG tablet Commonly known as:  REQUIP Take 1 mg by mouth at bedtime.   sacubitril-valsartan 24-26 MG Commonly known as:  ENTRESTO Take 1 tablet by mouth 2 (two) times daily.   spironolactone 25 MG tablet Commonly known as:  ALDACTONE Take 1 tablet (25 mg total) by mouth daily. Start taking on:  12/09/2016   triamcinolone cream 0.1 % Commonly known as:  KENALOG Apply 1 application topically 2 (two) times daily as needed.          Total Time in preparing paper work, data evaluation and todays exam - 35 minutes  Dustin Flock M.D on 12/08/2016 at 4:08 [facility]  [facility]   Office  [phone]

## 2016-12-09 DIAGNOSIS — J449 Chronic obstructive pulmonary disease, unspecified: Secondary | ICD-10-CM | POA: Diagnosis not present

## 2016-12-12 DIAGNOSIS — R0602 Shortness of breath: Secondary | ICD-10-CM | POA: Diagnosis not present

## 2016-12-12 DIAGNOSIS — E782 Mixed hyperlipidemia: Secondary | ICD-10-CM | POA: Diagnosis not present

## 2016-12-12 DIAGNOSIS — I420 Dilated cardiomyopathy: Secondary | ICD-10-CM | POA: Diagnosis not present

## 2016-12-12 DIAGNOSIS — R079 Chest pain, unspecified: Secondary | ICD-10-CM | POA: Diagnosis not present

## 2016-12-12 DIAGNOSIS — I2510 Atherosclerotic heart disease of native coronary artery without angina pectoris: Secondary | ICD-10-CM | POA: Diagnosis not present

## 2016-12-12 DIAGNOSIS — I10 Essential (primary) hypertension: Secondary | ICD-10-CM | POA: Diagnosis not present

## 2016-12-12 NOTE — Progress Notes (Signed)
[facility]  Patient Status: patient refused [place] due to copays on 12/11/16. Nurse did make visit with patient, had lots of questions about medications, nurse did get patient set up with CHF clinic. Notified Joni Reining, CM and Marshell Garfinkel, CM of refusal.  .   Florene Glen 12/12/2016, 9:16 AM

## 2016-12-15 DIAGNOSIS — I10 Essential (primary) hypertension: Secondary | ICD-10-CM | POA: Diagnosis not present

## 2016-12-15 DIAGNOSIS — J449 Chronic obstructive pulmonary disease, unspecified: Secondary | ICD-10-CM | POA: Diagnosis not present

## 2016-12-15 DIAGNOSIS — J439 Emphysema, unspecified: Secondary | ICD-10-CM | POA: Diagnosis not present

## 2016-12-15 DIAGNOSIS — I5040 Unspecified combined systolic (congestive) and diastolic (congestive) heart failure: Secondary | ICD-10-CM | POA: Diagnosis not present

## 2016-12-15 DIAGNOSIS — I2510 Atherosclerotic heart disease of native coronary artery without angina pectoris: Secondary | ICD-10-CM | POA: Diagnosis not present

## 2016-12-15 DIAGNOSIS — C679 Malignant neoplasm of bladder, unspecified: Secondary | ICD-10-CM | POA: Diagnosis not present

## 2016-12-19 DIAGNOSIS — R079 Chest pain, unspecified: Secondary | ICD-10-CM | POA: Diagnosis not present

## 2016-12-23 DIAGNOSIS — I2510 Atherosclerotic heart disease of native coronary artery without angina pectoris: Secondary | ICD-10-CM | POA: Diagnosis not present

## 2016-12-23 DIAGNOSIS — I5040 Unspecified combined systolic (congestive) and diastolic (congestive) heart failure: Secondary | ICD-10-CM | POA: Diagnosis not present

## 2016-12-23 DIAGNOSIS — I340 Nonrheumatic mitral (valve) insufficiency: Secondary | ICD-10-CM | POA: Diagnosis not present

## 2016-12-23 DIAGNOSIS — E782 Mixed hyperlipidemia: Secondary | ICD-10-CM | POA: Diagnosis not present

## 2016-12-23 DIAGNOSIS — J449 Chronic obstructive pulmonary disease, unspecified: Secondary | ICD-10-CM | POA: Diagnosis not present

## 2016-12-23 DIAGNOSIS — I10 Essential (primary) hypertension: Secondary | ICD-10-CM | POA: Diagnosis not present

## 2016-12-30 DIAGNOSIS — R9430 Abnormal result of cardiovascular function study, unspecified: Secondary | ICD-10-CM | POA: Diagnosis not present

## 2017-01-01 DIAGNOSIS — I255 Ischemic cardiomyopathy: Secondary | ICD-10-CM | POA: Diagnosis not present

## 2017-01-01 DIAGNOSIS — I2510 Atherosclerotic heart disease of native coronary artery without angina pectoris: Secondary | ICD-10-CM | POA: Diagnosis not present

## 2017-01-01 DIAGNOSIS — E782 Mixed hyperlipidemia: Secondary | ICD-10-CM | POA: Diagnosis not present

## 2017-01-01 DIAGNOSIS — I509 Heart failure, unspecified: Secondary | ICD-10-CM | POA: Diagnosis not present

## 2017-01-01 DIAGNOSIS — R0602 Shortness of breath: Secondary | ICD-10-CM | POA: Diagnosis not present

## 2017-01-01 DIAGNOSIS — I10 Essential (primary) hypertension: Secondary | ICD-10-CM | POA: Diagnosis not present

## 2017-01-09 DIAGNOSIS — J449 Chronic obstructive pulmonary disease, unspecified: Secondary | ICD-10-CM | POA: Diagnosis not present

## 2017-01-22 DIAGNOSIS — I509 Heart failure, unspecified: Secondary | ICD-10-CM | POA: Diagnosis not present

## 2017-01-22 DIAGNOSIS — I2510 Atherosclerotic heart disease of native coronary artery without angina pectoris: Secondary | ICD-10-CM | POA: Diagnosis not present

## 2017-01-22 DIAGNOSIS — J449 Chronic obstructive pulmonary disease, unspecified: Secondary | ICD-10-CM | POA: Diagnosis not present

## 2017-01-22 DIAGNOSIS — I10 Essential (primary) hypertension: Secondary | ICD-10-CM | POA: Diagnosis not present

## 2017-02-04 ENCOUNTER — Ambulatory Visit: Payer: PPO | Admitting: Urology

## 2017-02-04 ENCOUNTER — Encounter: Payer: Self-pay | Admitting: Urology

## 2017-02-04 VITALS — BP 165/67 | HR 55 | Ht 68.0 in | Wt 144.0 lb

## 2017-02-04 DIAGNOSIS — R972 Elevated prostate specific antigen [PSA]: Secondary | ICD-10-CM | POA: Diagnosis not present

## 2017-02-04 DIAGNOSIS — C679 Malignant neoplasm of bladder, unspecified: Secondary | ICD-10-CM

## 2017-02-04 DIAGNOSIS — N400 Enlarged prostate without lower urinary tract symptoms: Secondary | ICD-10-CM | POA: Diagnosis not present

## 2017-02-04 LAB — URINALYSIS, COMPLETE
Bilirubin, UA: NEGATIVE
Glucose, UA: NEGATIVE
Ketones, UA: NEGATIVE
Leukocytes, UA: NEGATIVE
NITRITE UA: NEGATIVE
PH UA: 5 (ref 5.0–7.5)
Protein, UA: NEGATIVE
RBC, UA: NEGATIVE
Specific Gravity, UA: <1.005 — ABNORMAL LOW (ref 1.005–1.030)
Urobilinogen, Ur: 0.2 mg/dL (ref 0.2–1.0)

## 2017-02-04 LAB — MICROSCOPIC EXAMINATION
Bacteria, UA: NONE SEEN
EPITHELIAL CELLS (NON RENAL): NONE SEEN /HPF (ref 0–10)
RBC MICROSCOPIC, UA: NONE SEEN /HPF (ref 0–?)
WBC, UA: NONE SEEN /hpf (ref 0–?)

## 2017-02-04 MED ORDER — CIPROFLOXACIN HCL 500 MG PO TABS
500.0000 mg | ORAL_TABLET | Freq: Once | ORAL | Status: AC
Start: 2017-02-04 — End: 2017-02-04
  Administered 2017-02-04: 500 mg via ORAL

## 2017-02-04 MED ORDER — LIDOCAINE HCL 2 % EX GEL
1.0000 "application " | Freq: Once | CUTANEOUS | Status: AC
  Administered 2017-02-04: 1 via URETHRAL

## 2017-02-04 NOTE — Progress Notes (Signed)
9:53 AM  02/04/17  Eric Mullins [birth year] [id number]  Referring provider: [name], MD [place], [place] [address]  Chief Complaint  Patient presents with  . 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  He returns today for cystoscopy.  Since his last visit, he's had worsening COPD with pulmonary edema.  History of upper tract TCC/ Bladder cancer He was  found to have left upper pole filling defect suspicious for upper tract TCC on CT Urogram 06/2014. Further workup of this lesion revealed a papillary upper pole lesion consistent with upper tract TCC. Given the inability to easily survey this lesion endoscopically, he underwent left hand-assisted laparoscopic nephroureterectomy on 11/20/2014.   Surgical pathology showed a low-grade superficial papillary ( inverted architecture) without evidence of invasion. Single lymph node was negative for malignancy. pTaN0Mx   Surveillance RUS wnl on 03/10/16 with stable complex cyst 6.4 cm.    Incidental 2 cm papillary bladder tumor posterior bladder wall identified at the time of holmium laser enucleation of the prostate on 04/07/2016.  Pathology consistent with low-grade, noninvasive papillary TCC. Lamina propria was present and uninvolved.   Most recent recurrence on 11/04/2016 with small papillary change adjacent to posterior right wall stellate scar. Pathology was again consistent with low-grade TA TCC.  Intravesical mitomycin was given at this time.  Most recent upper tract imaging in the form of right retrograde pyelogram which was unremarkable on 11/04/2016.  He returns today for cystoscopy as part of his surveillance.  Elevated PSA He underwent prostate biopsy on 07/18/14 which was negative for malignancy in setting of  elevated PSA to 17.7 (07/03/14), repeat 7.7 ng/dL in 02/10/15. TRUS vol 149 cc with large median lobe noted.  History of BPH/ urinary retention Developed urinary retention 03/01/16, failed VT x multiple on maximal medical management.  Unable to self cath due to prostate anatomy.  He was taken to the operating room on 04/07/2016 for holmium laser enucleation of the prostate, primarily treating his very large median lobe. Voiding well since surgery.    Surgical pathology shows 79 g of prostate tissue, consistent with benign prostatic tissue with stromal and glandular hyperplasia along with chronic inflammation.  Since his last visit, he has stopped finasteride.   PMH: Past Medical History:  Diagnosis Date  . BPH (benign prostatic hyperplasia)   . COPD (chronic obstructive pulmonary disease) ([place])   . Elevated PSA   . Encounter for removal of urinary catheter   . GERD (gastroesophageal reflux disease)   . Gross hematuria   . Heart murmur   . Hyperlipidemia   . Hypertension   . Incomplete bladder emptying   . Kidney filling defect   . Paraphimosis   . Primary cancer of left renal pelvis ([place])   . Stroke ([place]) 2010  . Urinary tract infection     Surgical History: Past Surgical History:  Procedure Laterality Date  . APPENDECTOMY    . BACK SURGERY     lower back. patient unsure if there is metal in back  . CATARACT EXTRACTION W/ INTRAOCULAR LENS  IMPLANT, BILATERAL Bilateral   . COCHLEAR IMPLANT Left    middle ear implant  . CYSTOSCOPY N/A 04/07/2016   Procedure: Fulgeration and evacuation of clot;  Surgeon: [name], MD;  Location: [facility];  Service: Urology;  Laterality: N/A;  . CYSTOSCOPY W/ RETROGRADES Right 04/07/2016   Procedure: CYSTOSCOPY WITH RETROGRADE PYELOGRAM;  Surgeon: [name], MD;  Location: [facility];  Service: Urology;  Laterality: Right;  . CYSTOSCOPY W/ URETERAL STENT PLACEMENT Right 11/04/2016   Procedure: CYSTOSCOPY WITH RETROGRADE PYELOGRAM/URETERAL  STENT PLACEMENT;  Surgeon: [name], MD;  Location: [facility];  Service: Urology;  Laterality: Right;  . EAR MASTOIDECTOMY W/ COCHLEAR IMPLANT W/ LANDMARK    . ELBOW SURGERY Left    ball and joint replaced  . FOOT SURGERY Left 1980   lawn mower accident  . HOLEP-LASER ENUCLEATION OF THE PROSTATE WITH MORCELLATION N/A 04/07/2016   Procedure: HOLEP-LASER ENUCLEATION OF THE PROSTATE WITH MORCELLATION/ TURBT;  Surgeon: [name], MD;  Location: [facility];  Service: Urology;  Laterality: N/A;  . TONSILLECTOMY    . TOTAL KNEE ARTHROPLASTY Left   . TOTAL NEPHRECTOMY Left 11/2013  . TRANSURETHRAL RESECTION OF BLADDER TUMOR WITH MITOMYCIN-C N/A 11/04/2016   Procedure: TRANSURETHRAL RESECTION OF BLADDER TUMOR WITH MITOMYCIN-C;  Surgeon: [name], MD;  Location: [facility];  Service: Urology;  Laterality: N/A;  . URETEROSCOPY Right 11/04/2016   Procedure: URETEROSCOPY;  Surgeon: [name], MD;  Location: [facility];  Service: Urology;  Laterality: Right;    Home Medications:  Allergies as of 02/04/2017   No Known Allergies     Medication List       Accurate as of 02/04/17  9:53 AM. Always use your most recent med list.          aspirin 81 MG tablet Take 81 mg by mouth daily.   atorvastatin 40 MG tablet Commonly known as:  LIPITOR Take 40 mg by mouth every evening.   carvedilol 3.125 MG tablet Commonly known as:  COREG Take 1 tablet by mouth 2 (two) times daily.   digoxin 0.125 MG tablet Commonly known as:  LANOXIN Take 1 tablet (0.125 mg total) by mouth daily.   EYE HEALTH Caps Take 1 capsule by mouth daily.   ferrous sulfate 324 (65 Fe) MG Tbec Take 1 tablet by mouth daily.   finasteride 5 MG tablet Commonly known as:  PROSCAR Take 1 tablet (5 mg total) by mouth daily.   Fluticasone-Salmeterol 250-50 MCG/DOSE Aepb Commonly known as:  ADVAIR Inhale 1 puff into the lungs 2 (two) times daily.   furosemide 40 MG tablet Commonly known as:  LASIX Take 1 tablet  (40 mg total) by mouth 2 (two) times daily.   gabapentin 300 MG capsule Commonly known as:  NEURONTIN Take 300 mg by mouth at bedtime. Reported on 03/06/2016   ipratropium-albuterol 0.5-2.5 (3) MG/3ML Soln Commonly known as:  DUONEB Take 3 mLs by nebulization every 4 (four) hours as needed.   pravastatin 40 MG tablet Commonly known as:  PRAVACHOL Take 40 mg by mouth at bedtime.   PROAIR HFA 108 (90 Base) MCG/ACT inhaler Generic drug:  albuterol Inhale 1-2 puffs into the lungs every 4 (four) hours as needed.   ranitidine 75 MG tablet Commonly known as:  ZANTAC Take 75 mg by mouth 2 (two) times daily as needed. Reported on 03/06/2016   rOPINIRole 1 MG tablet Commonly known as:  REQUIP Take 1 mg by mouth at bedtime.   sacubitril-valsartan 24-26 MG Commonly known as:  ENTRESTO Take 1 tablet by mouth 2 (two) times daily.   spironolactone 25 MG tablet Commonly known as:  ALDACTONE Take 1 tablet (25 mg total) by mouth daily.   triamcinolone cream 0.1 %  Commonly known as:  KENALOG Apply 1 application topically 2 (two) times daily as needed.       Allergies: No Known Allergies  Family History: Family History  Problem Relation Age of Onset  . Brain cancer Father   . Lung cancer Father   . Kidney disease Neg Hx   . Prostate cancer Neg Hx     Social History:  reports that he has been smoking Pipe.  He has never used smokeless tobacco. He reports that he does not drink alcohol or use drugs.   Physical Exam: BP (!) 165/67   Pulse (!) 55   Ht 5\' 8"  (1.727 m)   Wt 144 lb (65.3 kg)   BMI 21.90 kg/m   Constitutional:  Alert and oriented, No acute distress.  Accompanied to the office today by his wife. HEENT: [place] AT, moist mucus membranes.  Trachea midline, no masses. Cardiovascular: No clubbing, cyanosis, or edema.  Respiratory: Normal respiratory effort, no increased work of breathing.   GI: Abdomen is soft, nontender, nondistended, no abdominal masses [id number] phallus  with orthotopic meatus.   Skin: No rashes, bruises or suspicious lesions. Neurologic: Grossly intact, no focal deficits, moving all 4 extremities. Psychiatric: Normal mood and affect.  Laboratory Data: Lab Results  Component Value Date   WBC 5.7 12/06/2016   HGB 16.0 12/06/2016   HCT 47.1 12/06/2016   MCV 89.7 12/06/2016   PLT 135 (L) 12/06/2016    Lab Results  Component Value Date   CREATININE 1.65 (H) 12/08/2016    Cystoscopy Procedure Note  Patient identification was confirmed, informed consent was obtained, and patient was prepped using Betadine solution.  Lidocaine jelly was administered per urethral meatus.    Preoperative abx where received prior to procedure.     Pre-Procedure: - Inspection reveals a normal caliber ureteral meatus.  Procedure: The flexible cystoscope was introduced without difficulty - No urethral strictures/lesions are present. - Enlarged prostate with small posterior false pass just proximal to veru, bilobar coaptation - Normal bladder neck, no longer elevated - Bilateral ureteral orifices identified - Bladder mucosa Stellate scar on right lateral wall.  There is some fine less than 1 cm carpeting of papillary change on the left lateral bladder wall concerning for low-grade recurrence. - No bladder stones - Moderate trabeculation  Retroflexion shows interval resolution of large median lobe but what appears to be some progressive regrowth since last cystoscopy.  Small protrusion of bilateral lateral lobe just above bladder neck.   Post-Procedure: - Patient tolerated the procedure well   Assessment & Plan:   1. Urothelial carcinoma of kidney, left/ History of bladder cancer S/p L laparoscopic nephroureterectomy 11/2014. Surgical pathology showed a low-grade superficial papillary ( inverted architecture) without evidence of invasion. Single lymph node was negative for malignancy. pTaN0Mx.  Bladder recurrence with 2 cm tumor, LgTa TCC  04/07/16 as well as 10/2016, LgTa TCC 04/07/16.  Cystoscopy today with fine carpeting, suspicious for recurrence of low-grade noninvasive TCC.  Given his recent pulmonary issues and history of only low-grade tumor in the past, I have offered him cystoscopy, fulguration in the office. I explained the procedure as well as the risk and benefits in detail. After discussing this, he would like to proceed as planned. We will schedule this for next week.  2. Benign prostatic hypertrophy (BPH) with incomplete bladder emptying/ urinary retention S/p HoLEP of median lobe Symptomatically improved although some prostatic regrowth was appreciated today- advised to resume finasteride as a maintenance medication in order to  avoid recurrent retention  3. Elevated PSA S/p prostate biopsy on 07/18/14 which was negative for malignancy in setting of elevated PSA to 17.7 (07/03/14), repeat 7.7 ng/dL in 02/10/15.  Will readdress at follow up visit- given his age, PSA screening is no longer recommended  Return in about 1 week (around 02/11/2017) for cysto/ fulgeration of bladder tumor.    [name], MD  [facility] [address] [place], [place] [address] [phone]

## 2017-02-05 DIAGNOSIS — I2510 Atherosclerotic heart disease of native coronary artery without angina pectoris: Secondary | ICD-10-CM | POA: Diagnosis not present

## 2017-02-05 DIAGNOSIS — R0602 Shortness of breath: Secondary | ICD-10-CM | POA: Diagnosis not present

## 2017-02-05 DIAGNOSIS — I10 Essential (primary) hypertension: Secondary | ICD-10-CM | POA: Diagnosis not present

## 2017-02-05 DIAGNOSIS — E782 Mixed hyperlipidemia: Secondary | ICD-10-CM | POA: Diagnosis not present

## 2017-02-06 DIAGNOSIS — J449 Chronic obstructive pulmonary disease, unspecified: Secondary | ICD-10-CM | POA: Diagnosis not present

## 2017-02-12 ENCOUNTER — Ambulatory Visit (INDEPENDENT_AMBULATORY_CARE_PROVIDER_SITE_OTHER): Payer: PPO | Admitting: Urology

## 2017-02-12 ENCOUNTER — Encounter: Payer: Self-pay | Admitting: Urology

## 2017-02-12 VITALS — BP 162/52 | HR 55 | Wt 144.0 lb

## 2017-02-12 DIAGNOSIS — C679 Malignant neoplasm of bladder, unspecified: Secondary | ICD-10-CM | POA: Diagnosis not present

## 2017-02-12 DIAGNOSIS — D494 Neoplasm of unspecified behavior of bladder: Secondary | ICD-10-CM | POA: Diagnosis not present

## 2017-02-12 LAB — MICROSCOPIC EXAMINATION: Bacteria, UA: NONE SEEN

## 2017-02-12 LAB — URINALYSIS, COMPLETE
Bilirubin, UA: NEGATIVE
GLUCOSE, UA: NEGATIVE
Ketones, UA: NEGATIVE
LEUKOCYTES UA: NEGATIVE
NITRITE UA: NEGATIVE
Protein, UA: NEGATIVE
RBC, UA: NEGATIVE
Specific Gravity, UA: 1.01 (ref 1.005–1.030)
Urobilinogen, Ur: 0.2 mg/dL (ref 0.2–1.0)
pH, UA: 5.5 (ref 5.0–7.5)

## 2017-02-12 MED ORDER — LIDOCAINE HCL 2 % EX GEL
1.0000 "application " | Freq: Once | CUTANEOUS | Status: AC
  Administered 2017-02-12: 1 via URETHRAL

## 2017-02-12 MED ORDER — CIPROFLOXACIN HCL 500 MG PO TABS
500.0000 mg | ORAL_TABLET | Freq: Once | ORAL | Status: AC
  Administered 2017-02-12: 500 mg via ORAL

## 2017-02-12 MED ORDER — LIDOCAINE HCL 2 % IJ SOLN
50.0000 mL | Freq: Once | INTRAMUSCULAR | Status: AC
  Administered 2017-02-12: 1000 mg

## 2017-02-12 NOTE — Progress Notes (Signed)
Bladder Instillation  Due to Bladder Fulgeration patient is present today for a bladder instillation of 2% lidocaine. Patient was cleaned and prepped in a sterile fashion. A 16FR coude red rubber cath was placed and 50 ml of 2% Lidocaine was instilled through the catheter. Patient tolerated well.   Preformed by: Fonnie Jarvis, CMA and [name], MD

## 2017-02-16 NOTE — Progress Notes (Signed)
10:29 AM  02/12/17   Eric Mullins [birth year] [id number]  Referring provider: [name], MD [place], [place] [address]  Chief Complaint  Patient presents with  . 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  He returns to the office today for cystoscopy, fulguration of some low-grade appearing papillary lesions identified on cystoscopy last week.   History of upper tract TCC/ Bladder cancer He was  found to have left upper pole filling defect suspicious for upper tract TCC on CT Urogram 06/2014. Further workup of this lesion revealed a papillary upper pole lesion consistent with upper tract TCC. Given the inability to easily survey this lesion endoscopically, he underwent left hand-assisted laparoscopic nephroureterectomy on 11/20/2014.   Surgical pathology showed a low-grade superficial papillary ( inverted architecture) without evidence of invasion. Single lymph node was negative for malignancy. pTaN0Mx   Surveillance RUS wnl on 03/10/16 with stable complex cyst 6.4 cm.    Incidental 2 cm papillary bladder tumor posterior bladder wall identified at the time of holmium laser enucleation of the prostate on 04/07/2016.  Pathology consistent with low-grade, noninvasive papillary TCC. Lamina propria was present and uninvolved.   Most recent recurrence on 11/04/2016 with small papillary change adjacent to posterior right wall stellate scar. Pathology was again consistent with low-grade TA TCC.  Intravesical mitomycin was given at this time.  Most recent upper tract imaging in the form of right retrograde pyelogram which was unremarkable on 11/04/2016.  Cystoscopy on 02/04/2017 in the office showed some low-grade changes on the left bladder neck/trigone  concerning for very small low-grade superficial recurrence, less than 1 cm.  Elevated PSA He underwent prostate biopsy on 07/18/14 which was negative for malignancy in setting of elevated PSA to 17.7 (07/03/14), repeat 7.7 ng/dL in 02/10/15. TRUS vol 149 cc with large median lobe noted.  History of BPH/ urinary retention Developed urinary retention 03/01/16, failed VT x multiple on maximal medical management.  Unable to self cath due to prostate anatomy.  He was taken to the operating room on 04/07/2016 for holmium laser enucleation of the prostate, primarily treating his very large median lobe. Voiding well since surgery.    Surgical pathology shows 79 g of prostate tissue, consistent with benign prostatic tissue with stromal and glandular hyperplasia along with chronic inflammation.  Since his last visit, he has stopped finasteride.   PMH: Past Medical History:  Diagnosis Date  . BPH (benign prostatic hyperplasia)   . COPD (chronic obstructive pulmonary disease) ([place])   . Elevated PSA   . Encounter for removal of urinary catheter   . GERD (gastroesophageal reflux disease)   . Gross hematuria   . Heart murmur   . Hyperlipidemia   . Hypertension   . Incomplete bladder emptying   . Kidney filling defect   . Paraphimosis   . Primary cancer of left renal pelvis ([place])   . Stroke ([place]) 2010  . Urinary tract infection     Surgical History: Past Surgical History:  Procedure Laterality Date  . APPENDECTOMY    . BACK SURGERY     lower back. patient unsure if there is metal in back  . CATARACT EXTRACTION W/ INTRAOCULAR LENS  IMPLANT, BILATERAL Bilateral   . COCHLEAR IMPLANT Left  middle ear implant  . CYSTOSCOPY N/A 04/07/2016   Procedure: Fulgeration and evacuation of clot;  Surgeon: [name], MD;  Location: [facility];  Service: Urology;  Laterality: N/A;  . CYSTOSCOPY W/ RETROGRADES Right 04/07/2016   Procedure: CYSTOSCOPY WITH RETROGRADE PYELOGRAM;  Surgeon: [name], MD;   Location: [facility];  Service: Urology;  Laterality: Right;  . CYSTOSCOPY W/ URETERAL STENT PLACEMENT Right 11/04/2016   Procedure: CYSTOSCOPY WITH RETROGRADE PYELOGRAM/URETERAL STENT PLACEMENT;  Surgeon: [name], MD;  Location: [facility];  Service: Urology;  Laterality: Right;  . EAR MASTOIDECTOMY W/ COCHLEAR IMPLANT W/ LANDMARK    . ELBOW SURGERY Left    ball and joint replaced  . FOOT SURGERY Left 1980   lawn mower accident  . HOLEP-LASER ENUCLEATION OF THE PROSTATE WITH MORCELLATION N/A 04/07/2016   Procedure: HOLEP-LASER ENUCLEATION OF THE PROSTATE WITH MORCELLATION/ TURBT;  Surgeon: [name], MD;  Location: [facility];  Service: Urology;  Laterality: N/A;  . TONSILLECTOMY    . TOTAL KNEE ARTHROPLASTY Left   . TOTAL NEPHRECTOMY Left 11/2013  . TRANSURETHRAL RESECTION OF BLADDER TUMOR WITH MITOMYCIN-C N/A 11/04/2016   Procedure: TRANSURETHRAL RESECTION OF BLADDER TUMOR WITH MITOMYCIN-C;  Surgeon: [name], MD;  Location: [facility];  Service: Urology;  Laterality: N/A;  . URETEROSCOPY Right 11/04/2016   Procedure: URETEROSCOPY;  Surgeon: [name], MD;  Location: [facility];  Service: Urology;  Laterality: Right;    Home Medications:  Allergies as of 02/12/2017   No Known Allergies     Medication List       Accurate as of 02/12/17 11:59 PM. Always use your most recent med list.          aspirin 81 MG tablet Take 81 mg by mouth daily.   atorvastatin 40 MG tablet Commonly known as:  LIPITOR Take 40 mg by mouth every evening.   carvedilol 3.125 MG tablet Commonly known as:  COREG Take 1 tablet by mouth 2 (two) times daily.   digoxin 0.125 MG tablet Commonly known as:  LANOXIN Take 1 tablet (0.125 mg total) by mouth daily.   EYE HEALTH Caps Take 1 capsule by mouth daily.   ferrous sulfate 324 (65 Fe) MG Tbec Take 1 tablet by mouth daily.   finasteride 5 MG tablet Commonly known as:  PROSCAR Take 1 tablet (5 mg total) by mouth daily.     Fluticasone-Salmeterol 250-50 MCG/DOSE Aepb Commonly known as:  ADVAIR Inhale 1 puff into the lungs 2 (two) times daily.   furosemide 40 MG tablet Commonly known as:  LASIX Take 1 tablet (40 mg total) by mouth 2 (two) times daily.   gabapentin 300 MG capsule Commonly known as:  NEURONTIN Take 300 mg by mouth at bedtime. Reported on 03/06/2016   ipratropium-albuterol 0.5-2.5 (3) MG/3ML Soln Commonly known as:  DUONEB Take 3 mLs by nebulization every 4 (four) hours as needed.   pravastatin 40 MG tablet Commonly known as:  PRAVACHOL Take 40 mg by mouth at bedtime.   PROAIR HFA 108 (90 Base) MCG/ACT inhaler Generic drug:  albuterol Inhale 1-2 puffs into the lungs every 4 (four) hours as needed.   ranitidine 75 MG tablet Commonly known as:  ZANTAC Take 75 mg by mouth 2 (two) times daily as needed. Reported on 03/06/2016   rOPINIRole 1 MG tablet Commonly known as:  REQUIP Take 1 mg by mouth at bedtime.   sacubitril-valsartan 24-26 MG Commonly known as:  ENTRESTO Take 1 tablet by mouth 2 (two) times  daily.   spironolactone 25 MG tablet Commonly known as:  ALDACTONE Take 1 tablet (25 mg total) by mouth daily.   triamcinolone cream 0.1 % Commonly known as:  KENALOG Apply 1 application topically 2 (two) times daily as needed.       Allergies: No Known Allergies  Family History: Family History  Problem Relation Age of Onset  . Brain cancer Father   . Lung cancer Father   . Kidney disease Neg Hx   . Prostate cancer Neg Hx     Social History:  reports that he has been smoking Pipe.  He has never used smokeless tobacco. He reports that he does not drink alcohol or use drugs.   Physical Exam: BP (!) 162/52   Pulse (!) 55   Wt 144 lb (65.3 kg)   BMI 21.90 kg/m   Constitutional:  Alert and oriented, No acute distress.  Accompanied to the office today by his wife. HEENT: [place] AT, moist mucus membranes.  Trachea midline, no masses. Cardiovascular: No clubbing,  cyanosis, or edema.  Respiratory: Normal respiratory effort, no increased work of breathing.   GI: Abdomen is soft, nontender, nondistended, no abdominal masses [id number] phallus with orthotopic meatus.   Skin: No rashes, bruises or suspicious lesions. Neurologic: Grossly intact, no focal deficits, moving all 4 extremities. Psychiatric: Normal mood and affect.  Laboratory Data: Lab Results  Component Value Date   WBC 5.7 12/06/2016   HGB 16.0 12/06/2016   HCT 47.1 12/06/2016   MCV 89.7 12/06/2016   PLT 135 (L) 12/06/2016    Lab Results  Component Value Date   CREATININE 1.65 (H) 12/08/2016    Cystoscopy Procedure Note  Patient identification was confirmed, informed consent was obtained, and patient was prepped using Betadine solution.    Lidocaine solution, 2%, was instilled into the bladder using a 16 French Coude red rubber catheter, total of 50 cc which is allowed to dwell for 30 minutes.  Preoperative abx where received prior to procedure.     Pre-Procedure: - Inspection reveals a normal caliber ureteral meatus.  Procedure: The flexible cystoscope was introduced without difficulty - No urethral strictures/lesions are present. - Enlarged prostate with small posterior false pass just proximal to veru, bilobar coaptation - Normal bladder neck, no longer elevated - Bilateral ureteral orifices identified - Bladder mucosa Stellate scar on right lateral wall.  There is some fine less than 1 cm carpeting of papillary change on the left lateral bladder wall concerning for low-grade recurrence. - No bladder stones - Moderate trabeculation  Retroflexion shows interval resolution of large median lobe but what appears to be some progressive regrowth since last cystoscopy.  Small protrusion of bilateral lateral lobe just above bladder neck.  At this point in time, a Bugbee electrode was was brought using the coag current of 30, the lesions in question were fulgurated which was  well-tolerated.  At the end of the procedure, no additional suspicious bladder lesions were identified.   Post-Procedure: - Patient tolerated the procedure well   Assessment & Plan:   1. Urothelial carcinoma of kidney, left/ History of bladder cancer S/p L laparoscopic nephroureterectomy 11/2014. Surgical pathology showed a low-grade superficial papillary ( inverted architecture) without evidence of invasion. Single lymph node was negative for malignancy. pTaN0Mx.  Bladder recurrence with 2 cm tumor, LgTa TCC 04/07/16 as well as 10/2016, LgTa TCC 04/07/16.  Office fulguration today for with fine carpeting, suspicious for recurrence of low-grade noninvasive TCC.  2. Benign prostatic hypertrophy (BPH) with  incomplete bladder emptying/ urinary retention S/p HoLEP of median lobe Symptomatically improved although some prostatic regrowth noted on cysto- advised to resume finasteride as a maintenance medication in order to avoid recurrent retention  3. Elevated PSA S/p prostate biopsy on 07/18/14 which was negative for malignancy in setting of elevated PSA to 17.7 (07/03/14), repeat 7.7 ng/dL in 02/10/15.  Will readdress at follow up visit- given his age, PSA screening is no longer recommended  Return in about 3 months (around 05/15/2017) for cystoscopy.    [name], MD  [facility] [address] [place], [place] [address] [phone]

## 2017-03-09 DIAGNOSIS — J449 Chronic obstructive pulmonary disease, unspecified: Secondary | ICD-10-CM | POA: Diagnosis not present

## 2017-04-07 DIAGNOSIS — R0602 Shortness of breath: Secondary | ICD-10-CM | POA: Diagnosis not present

## 2017-04-07 DIAGNOSIS — I2510 Atherosclerotic heart disease of native coronary artery without angina pectoris: Secondary | ICD-10-CM | POA: Diagnosis not present

## 2017-04-07 DIAGNOSIS — E782 Mixed hyperlipidemia: Secondary | ICD-10-CM | POA: Diagnosis not present

## 2017-04-07 DIAGNOSIS — I5040 Unspecified combined systolic (congestive) and diastolic (congestive) heart failure: Secondary | ICD-10-CM | POA: Diagnosis not present

## 2017-04-08 DIAGNOSIS — J449 Chronic obstructive pulmonary disease, unspecified: Secondary | ICD-10-CM | POA: Diagnosis not present

## 2017-04-11 ENCOUNTER — Other Ambulatory Visit: Payer: Self-pay | Admitting: Urology

## 2017-05-15 ENCOUNTER — Other Ambulatory Visit: Payer: PPO | Admitting: Urology

## 2017-05-25 ENCOUNTER — Other Ambulatory Visit: Payer: PPO | Admitting: Urology

## 2017-05-25 ENCOUNTER — Telehealth: Payer: Self-pay | Admitting: Urology

## 2017-05-25 NOTE — Telephone Encounter (Signed)
Patient cancelled his cysto for today. He said he can't not find anybody to bring him. He says he will call us back later to reschd it. I let him know that it was important to reschedule this appointment as soon as possible.   Sharyn Lull

## 2017-06-22 ENCOUNTER — Telehealth: Payer: Self-pay | Admitting: Urology

## 2017-06-22 NOTE — Telephone Encounter (Signed)
Very sorry to hear this news.  Could you please send a condolence card from our office/ staff?  [name], MD

## 2017-06-22 NOTE — Telephone Encounter (Signed)
I just received a call from patient's wife that he passed away yesterday. She said he laid down and never woke up. Just wanted you to be aware.  Thanks, Sharyn Lull

## 2017-06-23 NOTE — Telephone Encounter (Signed)
Yes I gave the information to Lattie Haw and she is going to send one out

## 2017-06-24 ENCOUNTER — Other Ambulatory Visit: Payer: PPO | Admitting: Urology

## 2017-07-08 DEATH — deceased

## 2018-08-03 IMAGING — DX DG CHEST 1V PORT
1 series · 2 of 2 positions shown · non-contrast
Comparison: Chest radiographs 05/22/2012, chest CT 11/13/2014

CLINICAL DATA: Progressive shortness of breath for 2 days.

EXAM:
PORTABLE CHEST 1 VIEW

[Series 1: chest ap · 0.14mm/px · 2 of 2 slices shown]
[im 1/2]
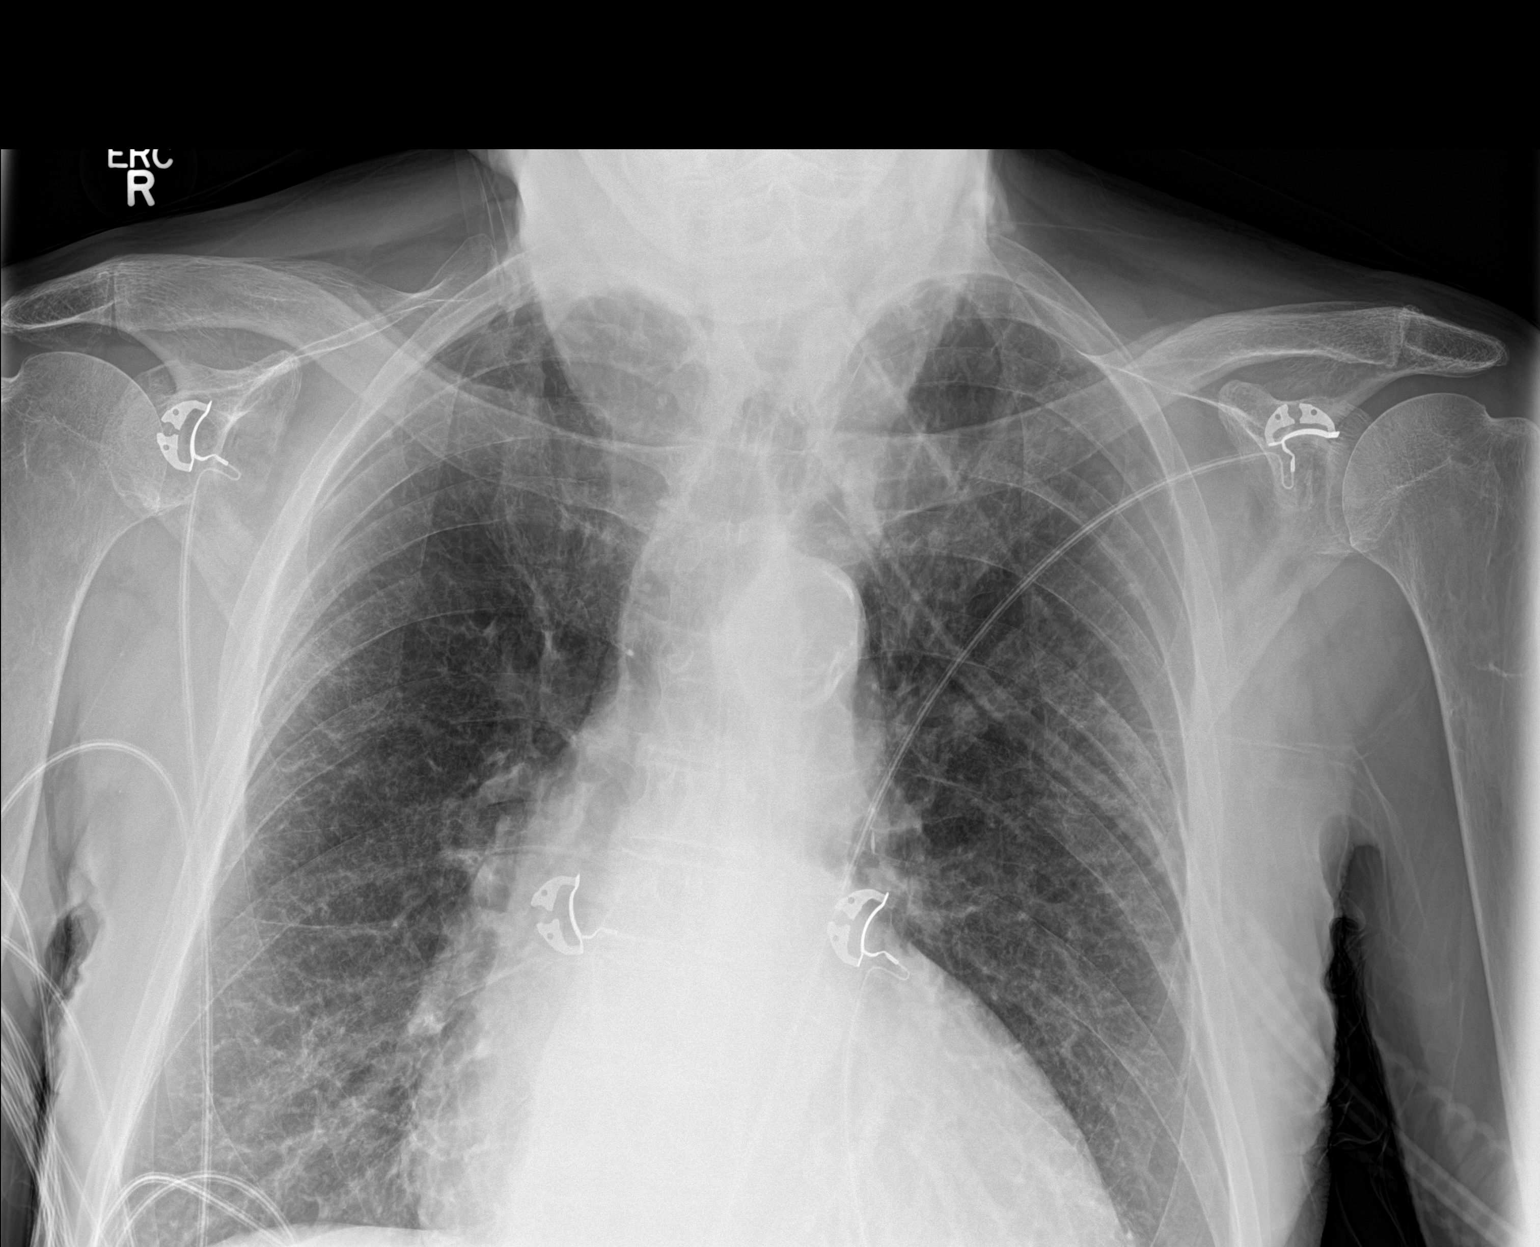
[im 2/2]
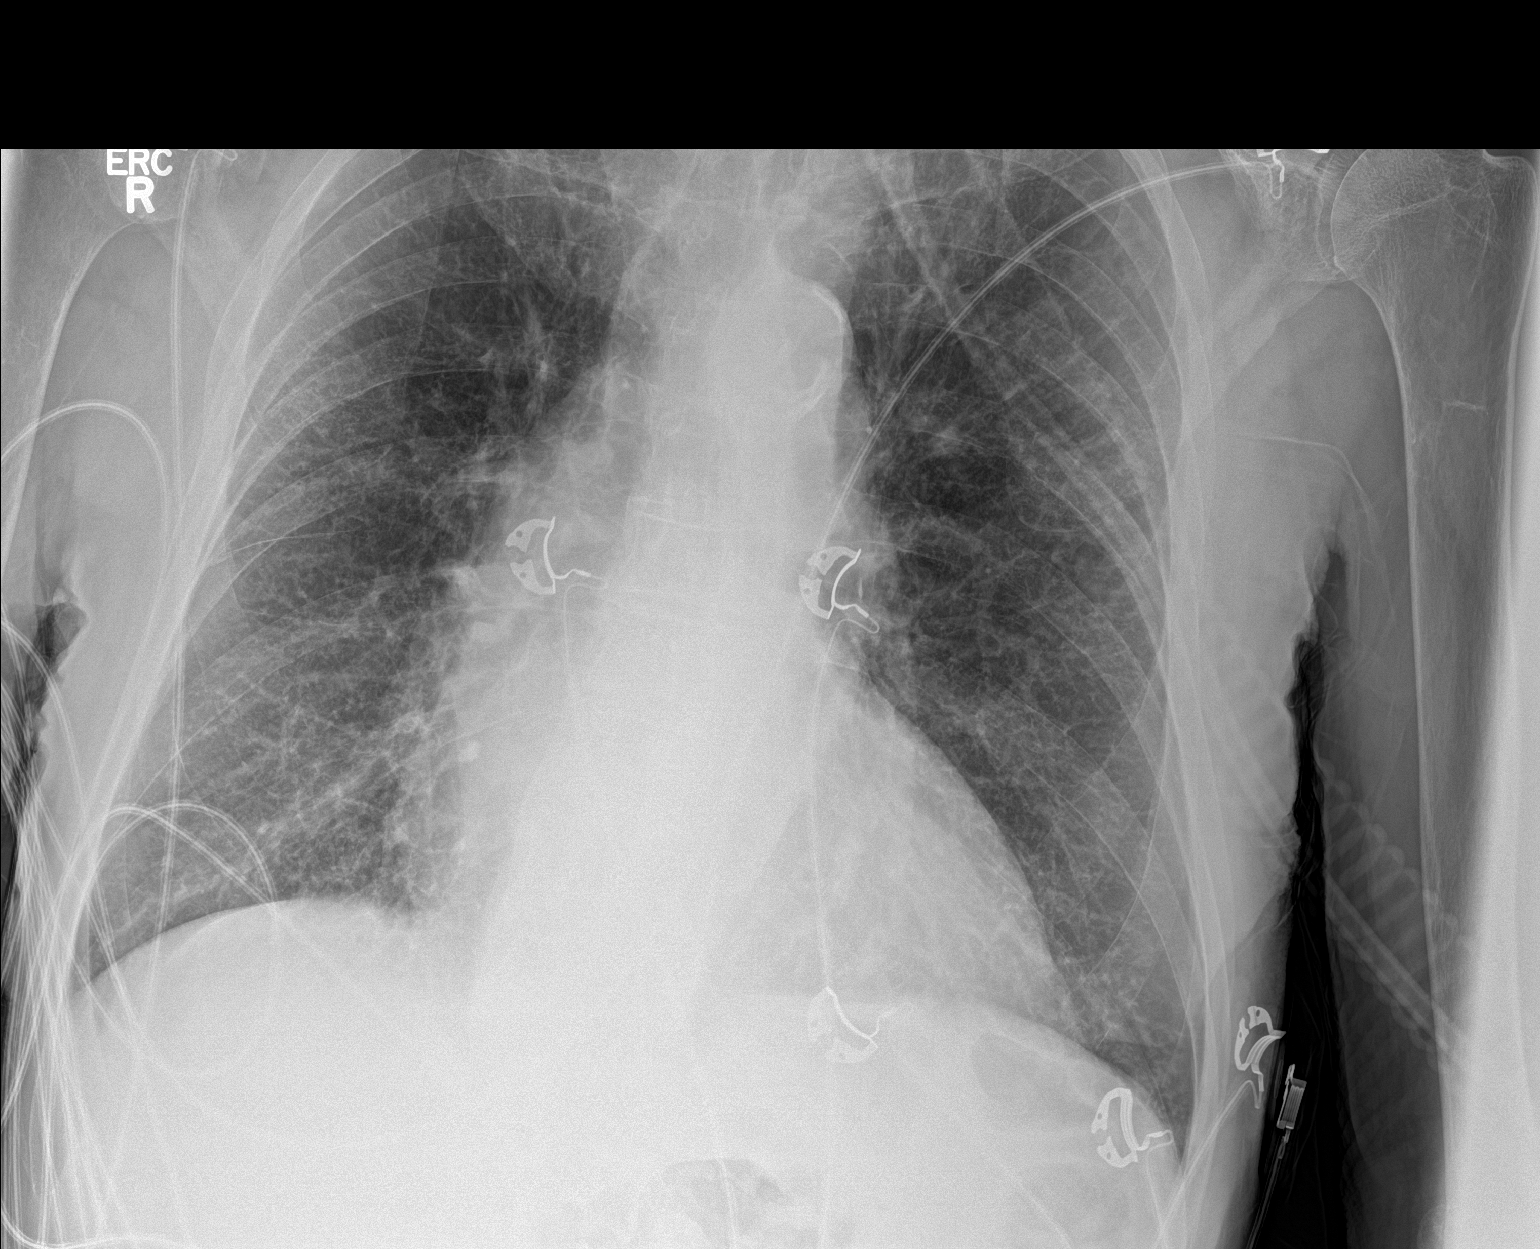

[2 of 2 positions shown; findings below may reference images not displayed]

FINDINGS: The heart is enlarged, there is atherosclerosis of the thoracic
aorta. Emphysema is again seen. Slightly more prominent interstitial
coarsening than on prior exam. No evidence of pleural fluid. No
pneumothorax. No confluent airspace disease. Some of the small
pulmonary nodules on prior CT are identified in the right upper
lung. No acute osseous abnormality is seen.
IMPRESSION: 1. Cardiomegaly with atherosclerosis of the thoracic aorta.
2. Emphysema. There is progressive interstitial coarsening from
prior radiograph, which may simply be emphysema, however
superimposed pulmonary edema is difficult to exclude.
